# Patient Record
Sex: Female | Born: 1975 | Race: White | Hispanic: No | Marital: Single | Smoking: Former smoker
Health system: Southern US, Community
[De-identification: ages and names within clinical notes are randomized; demographics above are authoritative.]

## PROBLEM LIST (undated history)

## (undated) DIAGNOSIS — E669 Obesity, unspecified: Secondary | ICD-10-CM

## (undated) DIAGNOSIS — M797 Fibromyalgia: Secondary | ICD-10-CM

## (undated) DIAGNOSIS — E119 Type 2 diabetes mellitus without complications: Secondary | ICD-10-CM

## (undated) DIAGNOSIS — I10 Essential (primary) hypertension: Secondary | ICD-10-CM

## (undated) HISTORY — PX: CHOLECYSTECTOMY: SHX55

## (undated) HISTORY — PX: HAND SURGERY: SHX662

## (undated) HISTORY — PX: TUBAL LIGATION: SHX77

## (undated) HISTORY — PX: TONSILLECTOMY: SUR1361

---

## 2009-01-03 DIAGNOSIS — R519 Headache, unspecified: Secondary | ICD-10-CM | POA: Insufficient documentation

## 2018-09-13 ENCOUNTER — Emergency Department (HOSPITAL_BASED_OUTPATIENT_CLINIC_OR_DEPARTMENT_OTHER): Payer: Self-pay

## 2018-09-13 ENCOUNTER — Emergency Department (HOSPITAL_BASED_OUTPATIENT_CLINIC_OR_DEPARTMENT_OTHER)
Admission: EM | Admit: 2018-09-13 | Discharge: 2018-09-13 | Disposition: A | Discharge status: Home / Self Care | Payer: Self-pay | Attending: Emergency Medicine | Admitting: Emergency Medicine

## 2018-09-13 ENCOUNTER — Encounter (HOSPITAL_BASED_OUTPATIENT_CLINIC_OR_DEPARTMENT_OTHER): Payer: Self-pay | Admitting: Emergency Medicine

## 2018-09-13 ENCOUNTER — Other Ambulatory Visit: Payer: Self-pay

## 2018-09-13 DIAGNOSIS — E119 Type 2 diabetes mellitus without complications: Secondary | ICD-10-CM | POA: Insufficient documentation

## 2018-09-13 DIAGNOSIS — Z79899 Other long term (current) drug therapy: Secondary | ICD-10-CM | POA: Insufficient documentation

## 2018-09-13 DIAGNOSIS — R1032 Left lower quadrant pain: Secondary | ICD-10-CM | POA: Insufficient documentation

## 2018-09-13 DIAGNOSIS — F17210 Nicotine dependence, cigarettes, uncomplicated: Secondary | ICD-10-CM | POA: Insufficient documentation

## 2018-09-13 DIAGNOSIS — R110 Nausea: Secondary | ICD-10-CM | POA: Insufficient documentation

## 2018-09-13 DIAGNOSIS — I10 Essential (primary) hypertension: Secondary | ICD-10-CM | POA: Insufficient documentation

## 2018-09-13 HISTORY — DX: Type 2 diabetes mellitus without complications: E11.9

## 2018-09-13 HISTORY — DX: Essential (primary) hypertension: I10

## 2018-09-13 LAB — URINALYSIS, ROUTINE W REFLEX MICROSCOPIC
Bilirubin Urine: NEGATIVE
Glucose, UA: NEGATIVE mg/dL
Hgb urine dipstick: NEGATIVE
Ketones, ur: NEGATIVE mg/dL
Nitrite: NEGATIVE
Protein, ur: NEGATIVE mg/dL
Specific Gravity, Urine: 1.01 (ref 1.005–1.030)
pH: 5.5 (ref 5.0–8.0)

## 2018-09-13 LAB — CBC WITH DIFFERENTIAL/PLATELET
Abs Immature Granulocytes: 0.05 10*3/uL (ref 0.00–0.07)
Basophils Absolute: 0.1 10*3/uL (ref 0.0–0.1)
Basophils Relative: 1 %
Eosinophils Absolute: 0.3 10*3/uL (ref 0.0–0.5)
Eosinophils Relative: 3 %
HCT: 40.6 % (ref 36.0–46.0)
Hemoglobin: 13.1 g/dL (ref 12.0–15.0)
Immature Granulocytes: 0 %
Lymphocytes Relative: 24 %
Lymphs Abs: 2.9 10*3/uL (ref 0.7–4.0)
MCH: 26.8 pg (ref 26.0–34.0)
MCHC: 32.3 g/dL (ref 30.0–36.0)
MCV: 83.2 fL (ref 80.0–100.0)
Monocytes Absolute: 0.7 10*3/uL (ref 0.1–1.0)
Monocytes Relative: 6 %
Neutro Abs: 8.2 10*3/uL — ABNORMAL HIGH (ref 1.7–7.7)
Neutrophils Relative %: 66 %
Platelets: 411 10*3/uL — ABNORMAL HIGH (ref 150–400)
RBC: 4.88 MIL/uL (ref 3.87–5.11)
RDW: 14.6 % (ref 11.5–15.5)
WBC: 12.3 10*3/uL — ABNORMAL HIGH (ref 4.0–10.5)
nRBC: 0 % (ref 0.0–0.2)

## 2018-09-13 LAB — COMPREHENSIVE METABOLIC PANEL
ALT: 16 U/L (ref 0–44)
AST: 14 U/L — ABNORMAL LOW (ref 15–41)
Albumin: 3.7 g/dL (ref 3.5–5.0)
Alkaline Phosphatase: 75 U/L (ref 38–126)
Anion gap: 11 (ref 5–15)
BUN: 8 mg/dL (ref 6–20)
CO2: 24 mmol/L (ref 22–32)
Calcium: 8.9 mg/dL (ref 8.9–10.3)
Chloride: 103 mmol/L (ref 98–111)
Creatinine, Ser: 0.77 mg/dL (ref 0.44–1.00)
GFR calc Af Amer: >60 mL/min (ref >60)
GFR calc non Af Amer: >60 mL/min (ref >60)
Glucose, Bld: 179 mg/dL — ABNORMAL HIGH (ref 70–99)
Potassium: 3.7 mmol/L (ref 3.5–5.1)
Sodium: 138 mmol/L (ref 135–145)
Total Bilirubin: 0.5 mg/dL (ref 0.3–1.2)
Total Protein: 7.6 g/dL (ref 6.5–8.1)

## 2018-09-13 LAB — URINALYSIS, MICROSCOPIC (REFLEX)

## 2018-09-13 LAB — PREGNANCY, URINE: Preg Test, Ur: NEGATIVE

## 2018-09-13 MED ORDER — MORPHINE SULFATE (PF) 4 MG/ML IV SOLN
4.0000 mg | Freq: Once | INTRAVENOUS | Status: AC
  Administered 2018-09-13: 4 mg via INTRAVENOUS
  Filled 2018-09-13: qty 1

## 2018-09-13 MED ORDER — ONDANSETRON 4 MG PO TBDP: 4.0000 mg | ORAL_TABLET | Freq: Three times a day (TID) | ORAL | 0 refills | Status: DC | PRN

## 2018-09-13 MED ORDER — FAMOTIDINE 20 MG PO TABS: 20.0000 mg | ORAL_TABLET | Freq: Every day | ORAL | 0 refills | Status: DC

## 2018-09-13 MED ORDER — SODIUM CHLORIDE 0.9 % IV BOLUS
1000.0000 mL | Freq: Once | INTRAVENOUS | Status: AC
  Administered 2018-09-13: 1000 mL via INTRAVENOUS

## 2018-09-13 MED ORDER — IOHEXOL 300 MG/ML  SOLN
100.0000 mL | Freq: Once | INTRAMUSCULAR | Status: AC | PRN
  Administered 2018-09-13: 100 mL via INTRAVENOUS

## 2018-09-13 MED ORDER — ONDANSETRON HCL 4 MG/2ML IJ SOLN
4.0000 mg | Freq: Once | INTRAMUSCULAR | Status: AC
  Administered 2018-09-13: 4 mg via INTRAVENOUS
  Filled 2018-09-13: qty 2

## 2018-09-13 MED ORDER — DICYCLOMINE HCL 20 MG PO TABS: 20.0000 mg | ORAL_TABLET | Freq: Two times a day (BID) | ORAL | 0 refills | Status: DC

## 2018-09-13 NOTE — ED Triage Notes (Signed)
Pt c/o left flank pain with radiation to back that pt reports starting last night. Pt also c/o N, denies vomiting. Denies dysuria.

## 2018-09-13 NOTE — ED Notes (Signed)
ED Provider at bedside. 

## 2018-09-13 NOTE — ED Provider Notes (Signed)
[facility] EMERGENCY DEPARTMENT Provider Note   CSN: [id number] Arrival date & time: 09/13/18  0700     History   Chief Complaint Chief Complaint  Patient presents with   Flank Pain    HPI Nichole Stone is a 43 y.o. female.     Pt presents to the ED today with left sided flank pain and nausea.  She had similar sx about 2 weeks ago and it went away.  She developed pain again last night.  She has never had a kidney stone in the past.  The pt denies any f/c.  No sob or cough.     Past Medical History:  Diagnosis Date   Diabetes mellitus without complication (HCC)    Hypertension     There are no active problems to display for this patient.    OB History   No obstetric history on file.      Home Medications    Prior to Admission medications   Medication Sig Start Date End Date Taking? Authorizing Provider  dicyclomine (BENTYL) 20 MG tablet Take 1 tablet (20 mg total) by mouth 2 (two) times daily. 09/13/18   Jacalyn Lefevre, MD  famotidine (PEPCID) 20 MG tablet Take 1 tablet (20 mg total) by mouth daily. 09/13/18   Jacalyn Lefevre, MD  ondansetron (ZOFRAN ODT) 4 MG disintegrating tablet Take 1 tablet (4 mg total) by mouth every 8 (eight) hours as needed. 09/13/18   Jacalyn Lefevre, MD    Family History No family history on file.  Social History Social History   Tobacco Use   Smoking status: Current Every Day Smoker    Types: Cigarettes   Smokeless tobacco: Never Used  Substance Use Topics   Alcohol use: Never    Frequency: Never   Drug use: Never     Allergies   Lisinopril   Review of Systems Review of Systems  Gastrointestinal: Positive for nausea.  Genitourinary: Positive for flank pain.  All other systems reviewed and are negative.    Physical Exam Updated Vital Signs BP (!) 155/91    Pulse 80    Temp 98.5 F (36.9 C) (Oral)    Resp 18    Ht 5\' 4"  (1.626 m)    Wt 127 kg    LMP 08/30/2018 (Approximate)    SpO2 100%    BMI  48.06 kg/m   Physical Exam Vitals signs and nursing note reviewed.  Constitutional:      Appearance: Normal appearance. She is obese.  HENT:     Head: Normocephalic and atraumatic.     Right Ear: External ear normal.     Left Ear: External ear normal.     Nose: Nose normal.     Mouth/Throat:     Mouth: Mucous membranes are moist.     Pharynx: Oropharynx is clear.  Eyes:     Extraocular Movements: Extraocular movements intact.     Conjunctiva/sclera: Conjunctivae normal.     Pupils: Pupils are equal, round, and reactive to light.  Neck:     Musculoskeletal: Normal range of motion and neck supple.  Cardiovascular:     Rate and Rhythm: Normal rate and regular rhythm.     Pulses: Normal pulses.     Heart sounds: Normal heart sounds.  Pulmonary:     Effort: Pulmonary effort is normal.     Breath sounds: Normal breath sounds.  Abdominal:     General: Abdomen is flat. Bowel sounds are normal.  Palpations: Abdomen is soft.  Musculoskeletal: Normal range of motion.  Skin:    General: Skin is warm.     Capillary Refill: Capillary refill takes less than 2 seconds.  Neurological:     General: No focal deficit present.     Mental Status: She is alert and oriented to person, place, and time.  Psychiatric:        Mood and Affect: Mood normal.        Behavior: Behavior normal.        Thought Content: Thought content normal.        Judgment: Judgment normal.      ED Treatments / Results  Labs (all labs ordered are listed, but only abnormal results are displayed) Labs Reviewed  CBC WITH DIFFERENTIAL/PLATELET - Abnormal; Notable for the following components:      Result Value   WBC 12.3 (*)    Platelets 411 (*)    Neutro Abs 8.2 (*)    All other components within normal limits  COMPREHENSIVE METABOLIC PANEL - Abnormal; Notable for the following components:   Glucose, Bld 179 (*)    AST 14 (*)    All other components within normal limits  URINALYSIS, ROUTINE W REFLEX  MICROSCOPIC - Abnormal; Notable for the following components:   Leukocytes,Ua TRACE (*)    All other components within normal limits  URINALYSIS, MICROSCOPIC (REFLEX) - Abnormal; Notable for the following components:   Bacteria, UA FEW (*)    All other components within normal limits  PREGNANCY, URINE    EKG None  Radiology Ct Abdomen Pelvis W Contrast  Result Date: 09/13/2018 CLINICAL DATA:  Acute left flank pain. EXAM: CT ABDOMEN AND PELVIS WITH CONTRAST TECHNIQUE: Multidetector CT imaging of the abdomen and pelvis was performed using the standard protocol following bolus administration of intravenous contrast. CONTRAST:  159mL OMNIPAQUE IOHEXOL 300 MG/ML  SOLN COMPARISON:  None. FINDINGS: Lower chest: No acute abnormality. Hepatobiliary: No focal liver abnormality is seen. Status post cholecystectomy. No biliary dilatation. Pancreas: Unremarkable. No pancreatic ductal dilatation or surrounding inflammatory changes. Spleen: Normal in size without focal abnormality. Adrenals/Urinary Tract: 1.6 cm right adrenal nodule is noted. Left adrenal gland is unremarkable. No hydronephrosis or renal obstruction is noted. Small cyst is noted in lower pole of right kidney. No renal or ureteral calculi are noted. Urinary bladder is unremarkable. Stomach/Bowel: Stomach is within normal limits. Appendix appears normal. No evidence of bowel wall thickening, distention, or inflammatory changes. 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 No enlarged abdominal or pelvic lymph nodes. Reproductive: Uterus and bilateral adnexa are unremarkable. Other: No abdominal wall hernia or abnormality. No abdominopelvic ascites. Musculoskeletal: No acute or significant osseous findings. IMPRESSION: 1.6 cm calcified splenic artery aneurysm is noted. 1.6 cm right adrenal nodule is noted. Follow-up CT scan or MRI in 1 year is recommended to ensure stability. No other abnormality is noted in the abdomen or  pelvis. Electronically Signed   By: Marijo Conception M.D.   On: 09/13/2018 09:03    Procedures Procedures (including critical care time)  Medications Ordered in ED Medications  sodium chloride 0.9 % bolus 1,000 mL (0 mLs Intravenous Stopped 09/13/18 0902)  ondansetron (ZOFRAN) injection 4 mg (4 mg Intravenous Given 09/13/18 0739)  morphine 4 MG/ML injection 4 mg (4 mg Intravenous Given 09/13/18 0739)  iohexol (OMNIPAQUE) 300 MG/ML solution 100 mL (100 mLs Intravenous Contrast Given 09/13/18 0834)     Initial Impression / Assessment and Plan / ED Course  I have reviewed the triage vital signs and the nursing notes.  Pertinent labs & imaging results that were available during my care of the patient were reviewed by me and considered in my medical decision making (see chart for details).     Pt is feeling much better.  WBC is slightly elevated, but other labs ok.  CT shows nothing acute.  Pt is told of the splenic a. Aneurysm and the adrenal nodule and the need for f/u in 1 year for repeat CT scan.  Pt is stable for d/c home.  Return if worse.  Final Clinical Impressions(s) / ED Diagnoses   Final diagnoses:  Left lower quadrant abdominal pain    ED Discharge Orders         Ordered    dicyclomine (BENTYL) 20 MG tablet  2 times daily     09/13/18 0921    ondansetron (ZOFRAN ODT) 4 MG disintegrating tablet  Every 8 hours PRN     09/13/18 0922    famotidine (PEPCID) 20 MG tablet  Daily     09/13/18 0922           Jacalyn LefevreHaviland, Dara Camargo, MD 09/13/18 [phone]

## 2018-09-13 NOTE — Discharge Instructions (Signed)
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  You will need to get a repeat CT scan in 1 year to ensure stability.

## 2018-09-13 NOTE — ED Notes (Signed)
Patient transported to CT 

## 2018-11-03 ENCOUNTER — Emergency Department (HOSPITAL_BASED_OUTPATIENT_CLINIC_OR_DEPARTMENT_OTHER)
Admission: EM | Admit: 2018-11-03 | Discharge: 2018-11-03 | Disposition: A | Discharge status: Home / Self Care | Payer: Self-pay | Attending: Emergency Medicine | Admitting: Emergency Medicine

## 2018-11-03 ENCOUNTER — Emergency Department (HOSPITAL_BASED_OUTPATIENT_CLINIC_OR_DEPARTMENT_OTHER): Payer: Self-pay

## 2018-11-03 ENCOUNTER — Encounter (HOSPITAL_BASED_OUTPATIENT_CLINIC_OR_DEPARTMENT_OTHER): Payer: Self-pay

## 2018-11-03 ENCOUNTER — Other Ambulatory Visit: Payer: Self-pay

## 2018-11-03 DIAGNOSIS — Z79899 Other long term (current) drug therapy: Secondary | ICD-10-CM | POA: Insufficient documentation

## 2018-11-03 DIAGNOSIS — R109 Unspecified abdominal pain: Secondary | ICD-10-CM | POA: Insufficient documentation

## 2018-11-03 DIAGNOSIS — F17210 Nicotine dependence, cigarettes, uncomplicated: Secondary | ICD-10-CM | POA: Insufficient documentation

## 2018-11-03 DIAGNOSIS — E119 Type 2 diabetes mellitus without complications: Secondary | ICD-10-CM | POA: Insufficient documentation

## 2018-11-03 DIAGNOSIS — I10 Essential (primary) hypertension: Secondary | ICD-10-CM | POA: Insufficient documentation

## 2018-11-03 HISTORY — DX: Fibromyalgia: M79.7

## 2018-11-03 LAB — URINALYSIS, ROUTINE W REFLEX MICROSCOPIC
Bilirubin Urine: NEGATIVE
Glucose, UA: NEGATIVE mg/dL
Hgb urine dipstick: NEGATIVE
Ketones, ur: NEGATIVE mg/dL
Leukocytes,Ua: NEGATIVE
Nitrite: NEGATIVE
Protein, ur: NEGATIVE mg/dL
Specific Gravity, Urine: >1.03 — ABNORMAL HIGH (ref 1.005–1.030)
pH: 5.5 (ref 5.0–8.0)

## 2018-11-03 LAB — CBC
HCT: 39.1 % (ref 36.0–46.0)
Hemoglobin: 12.5 g/dL (ref 12.0–15.0)
MCH: 27.3 pg (ref 26.0–34.0)
MCHC: 32 g/dL (ref 30.0–36.0)
MCV: 85.4 fL (ref 80.0–100.0)
Platelets: 380 10*3/uL (ref 150–400)
RBC: 4.58 MIL/uL (ref 3.87–5.11)
RDW: 14.5 % (ref 11.5–15.5)
WBC: 10.6 10*3/uL — ABNORMAL HIGH (ref 4.0–10.5)
nRBC: 0 % (ref 0.0–0.2)

## 2018-11-03 LAB — COMPREHENSIVE METABOLIC PANEL
ALT: 13 U/L (ref 0–44)
AST: 13 U/L — ABNORMAL LOW (ref 15–41)
Albumin: 3.6 g/dL (ref 3.5–5.0)
Alkaline Phosphatase: 72 U/L (ref 38–126)
Anion gap: 9 (ref 5–15)
BUN: 9 mg/dL (ref 6–20)
CO2: 25 mmol/L (ref 22–32)
Calcium: 8.5 mg/dL — ABNORMAL LOW (ref 8.9–10.3)
Chloride: 105 mmol/L (ref 98–111)
Creatinine, Ser: 0.83 mg/dL (ref 0.44–1.00)
GFR calc Af Amer: >60 mL/min (ref >60)
GFR calc non Af Amer: >60 mL/min (ref >60)
Glucose, Bld: 133 mg/dL — ABNORMAL HIGH (ref 70–99)
Potassium: 3.6 mmol/L (ref 3.5–5.1)
Sodium: 139 mmol/L (ref 135–145)
Total Bilirubin: 0.5 mg/dL (ref 0.3–1.2)
Total Protein: 6.9 g/dL (ref 6.5–8.1)

## 2018-11-03 LAB — LIPASE, BLOOD: Lipase: 29 U/L (ref 11–51)

## 2018-11-03 LAB — PREGNANCY, URINE: Preg Test, Ur: NEGATIVE

## 2018-11-03 MED ORDER — HYDROCODONE-ACETAMINOPHEN 5-325 MG PO TABS: 2.0000 | ORAL_TABLET | Freq: Four times a day (QID) | ORAL | 0 refills | Status: DC | PRN

## 2018-11-03 MED ORDER — IOHEXOL 350 MG/ML SOLN
100.0000 mL | Freq: Once | INTRAVENOUS | Status: AC | PRN
  Administered 2018-11-03: 100 mL via INTRAVENOUS

## 2018-11-03 MED ORDER — HYDROMORPHONE HCL 1 MG/ML IJ SOLN
1.0000 mg | Freq: Once | INTRAMUSCULAR | Status: AC
  Administered 2018-11-03: 20:00:00 1 mg via INTRAVENOUS
  Filled 2018-11-03: qty 1

## 2018-11-03 MED ORDER — SODIUM CHLORIDE 0.9% FLUSH
3.0000 mL | Freq: Once | INTRAVENOUS | Status: DC
  Filled 2018-11-03: qty 3

## 2018-11-03 MED ORDER — ONDANSETRON HCL 4 MG/2ML IJ SOLN
4.0000 mg | Freq: Once | INTRAMUSCULAR | Status: AC
  Administered 2018-11-03: 4 mg via INTRAVENOUS
  Filled 2018-11-03: qty 2

## 2018-11-03 NOTE — ED Notes (Signed)
Patient transported to CT 

## 2018-11-03 NOTE — ED Notes (Signed)
ED Provider at bedside. 

## 2018-11-03 NOTE — ED Triage Notes (Signed)
Pt c/o LLQ pain that radiates to back x 2 days-states she was seen here for same recently -NAD-steady gait

## 2018-11-03 NOTE — Discharge Instructions (Addendum)
Your CT scan today is stable from prior imaging.  The aneurysm is unchanged in size.  I'm not sure if this is actually the cause of your pain, but I would recommend following up with vascular surgery.  Please see the attached contact information. Take the prescribed pain medication as needed.

## 2018-11-03 NOTE — ED Provider Notes (Signed)
[facility] EMERGENCY DEPARTMENT Provider Note   CSN: [id number] Arrival date & time: 11/03/18  1850     History   Chief Complaint Chief Complaint  Patient presents with   Abdominal Pain    HPI Nichole Stone is a 43 y.o. female.     HPI   43 year old female with left abdominal/flank pain.  She was actually seen in the emergency room almost 2 months ago for similar symptoms.  She had imaging at that time which was significant for a 1.6 cm splenic artery aneurysm.  Otherwise imaging pretty unremarkable as well as her other work-up with regards to etiology.  Her symptoms improved from that time although she would occasionally get a twinge of pain in the same area.  Yesterday she began having more severe symptoms.  Now the pain is radiating into her back somewhat.  No shoulder pain.  No nausea or vomiting.  No urinary complaints.  No change in her bowel movements.  No dizziness or lightheadedness.  She not anticoagulated.  Past Medical History:  Diagnosis Date   Diabetes mellitus without complication ([place])    Fibromyalgia    Hypertension     There are no active problems to display for this patient.   Past Surgical History:  Procedure Laterality Date   CHOLECYSTECTOMY     TONSILLECTOMY     TUBAL LIGATION       OB History   No obstetric history on file.      Home Medications    Prior to Admission medications   Medication Sig Start Date End Date Taking? Authorizing Provider  dicyclomine (BENTYL) 20 MG tablet Take 1 tablet (20 mg total) by mouth 2 (two) times daily. 09/13/18   Isla Pence, MD  famotidine (PEPCID) 20 MG tablet Take 1 tablet (20 mg total) by mouth daily. 09/13/18   Isla Pence, MD  ondansetron (ZOFRAN ODT) 4 MG disintegrating tablet Take 1 tablet (4 mg total) by mouth every 8 (eight) hours as needed. 09/13/18   Isla Pence, MD    Family History No family history on file.  Social History Social History   Tobacco Use     Smoking status: Current Every Day Smoker    Types: Cigarettes   Smokeless tobacco: Never Used  Substance Use Topics   Alcohol use: Never    Frequency: Never   Drug use: Never     Allergies   Lisinopril   Review of Systems Review of Systems All systems reviewed and negative, other than as noted in HPI.   Physical Exam Updated Vital Signs BP (!) 157/89 (BP Location: Right Arm)    Pulse 81    Temp 98.9 F (37.2 C) (Oral)    Resp 20    Ht 5\' 5"  (1.651 m)    Wt 131.5 kg    LMP 10/20/2018    SpO2 98%    BMI 48.26 kg/m   Physical Exam Vitals signs and nursing note reviewed.  Constitutional:      General: She is not in acute distress.    Appearance: She is well-developed. She is obese.  HENT:     Head: Normocephalic and atraumatic.  Eyes:     General:        Right eye: No discharge.        Left eye: No discharge.     Conjunctiva/sclera: Conjunctivae normal.  Neck:     Musculoskeletal: Neck supple.  Cardiovascular:     Rate and Rhythm: Normal rate and regular  rhythm.     Heart sounds: Normal heart sounds. No murmur. No friction rub. No gallop.   Pulmonary:     Effort: Pulmonary effort is normal. No respiratory distress.     Breath sounds: Normal breath sounds.  Abdominal:     General: There is no distension.     Palpations: Abdomen is soft.     Tenderness: There is abdominal tenderness.     Comments: Mild tenderness to palpation in left upper and left lower quadrant without rebound or guarding.  No distention.  No CVA tenderness.  Musculoskeletal:        General: No tenderness.  Skin:    General: Skin is warm and dry.  Neurological:     Mental Status: She is alert.  Psychiatric:        Behavior: Behavior normal.        Thought Content: Thought content normal.      ED Treatments / Results  Labs (all labs ordered are listed, but only abnormal results are displayed) Labs Reviewed  COMPREHENSIVE METABOLIC PANEL - Abnormal; Notable for the following  components:      Result Value   Glucose, Bld 133 (*)    Calcium 8.5 (*)    AST 13 (*)    All other components within normal limits  CBC - Abnormal; Notable for the following components:   WBC 10.6 (*)    All other components within normal limits  URINALYSIS, ROUTINE W REFLEX MICROSCOPIC - Abnormal; Notable for the following components:   APPearance CLOUDY (*)    Specific Gravity, Urine >1.030 (*)    All other components within normal limits  LIPASE, BLOOD  PREGNANCY, URINE    EKG None  Radiology No results found.   Ct Abdomen Pelvis W Contrast  Result Date: 11/03/2018 CLINICAL DATA:  Bruit with left abdominal and flank pain history of splenic artery aneurysm on prior imaging left lower quadrant pain EXAM: CT ABDOMEN AND PELVIS WITH CONTRAST TECHNIQUE: Multidetector CT imaging of the abdomen and pelvis was performed using the standard protocol following bolus administration of intravenous contrast. CONTRAST:  100mL OMNIPAQUE IOHEXOL 350 MG/ML SOLN COMPARISON:  09/13/2018 CT FINDINGS: Lower chest: Lung bases demonstrate no acute consolidation or pleural effusion. The heart size is within normal limits. Hepatobiliary: No focal liver abnormality is seen. Status post cholecystectomy. No biliary dilatation. Pancreas: Unremarkable. No pancreatic ductal dilatation or surrounding inflammatory changes. Spleen: Normal in size without focal abnormality. Adrenals/Urinary Tract: Stable 15 mm right adrenal mass. Left adrenal gland is normal. Kidneys show no hydronephrosis. The urinary bladder is unremarkable. Small cyst lower pole right kidney. Stomach/Bowel: Stomach is within normal limits. Appendix appears normal. No evidence of bowel wall thickening, distention, or inflammatory changes. Vascular/Lymphatic: Nonaneurysmal aorta. Stable 1.6 cm calcified left splenic artery aneurysm. No surrounding hematoma or stranding. Reproductive: Uterus unremarkable. No suspicious adnexal mass. Bilateral ligation  clips. Other: Negative for free air or free fluid. Small fat in the umbilical region Musculoskeletal: No acute or significant osseous findings. IMPRESSION: 1. No CT evidence for acute intra-abdominal or pelvic abnormality. Stable size and appearance of 1.6 cm calcified left splenic artery aneurysm. 2. 1.5 cm right adrenal mass for which follow-up imaging was previously recommended Electronically Signed   By: Jasmine PangKim  Fujinaga M.D.   On: 11/03/2018 21:08    Procedures Procedures (including critical care time)  Medications Ordered in ED Medications  sodium chloride flush (NS) 0.9 % injection 3 mL (3 mLs Intravenous Not Given 11/03/18 1932)  HYDROmorphone (DILAUDID)  injection 1 mg (has no administration in time range)     Initial Impression / Assessment and Plan / ED Course  I have reviewed the triage vital signs and the nursing notes.  Pertinent labs & imaging results that were available during my care of the patient were reviewed by me and considered in my medical decision making (see chart for details).  Unclear etiology of her abdominal pain.  Splenic artery aneurysm again noted, unchanged from prior imaging.  Could potentially be the etiology of her symptoms although I am not convinced of this.  He did not have a clear alternative diagnosis at this time though.  Given the fact that she is having continued symptoms, I advised that she make an appointment to see vascular surgery.  Return precautions were discussed.  Outpatient follow-up otherwise.  Final Clinical Impressions(s) / ED Diagnoses   Final diagnoses:  Abdominal pain, unspecified abdominal location    ED Discharge Orders    None       Raeford Razor, MD 11/07/18 1053

## 2018-11-03 NOTE — ED Notes (Signed)
Pt complaining of nausea, EDP notfied. See new orders

## 2018-11-11 ENCOUNTER — Other Ambulatory Visit: Payer: Self-pay

## 2018-11-11 ENCOUNTER — Ambulatory Visit (INDEPENDENT_AMBULATORY_CARE_PROVIDER_SITE_OTHER): Payer: Self-pay | Admitting: Vascular Surgery

## 2018-11-11 ENCOUNTER — Encounter: Payer: Self-pay | Admitting: Vascular Surgery

## 2018-11-11 VITALS — BP 141/91 | HR 81 | Temp 97.7°F | Resp 20 | Ht 65.0 in | Wt 286.0 lb

## 2018-11-11 DIAGNOSIS — I728 Aneurysm of other specified arteries: Secondary | ICD-10-CM

## 2018-11-11 NOTE — Progress Notes (Signed)
Patient ID: Nichole Stone, female   DOB: 1975-07-13, 43 y.o.   MRN: [id number]  Reason for Consult: New Patient (Initial Visit)   Referred by No ref. provider found  Subjective:     HPI:  Nichole Stone is a 43 y.o. female with now a few month history of left abdominal pain.  She states this is really in her left lower quadrant radiating down.  She was found to have splenic artery aneurysm.  Has no other history of aneurysm disease.  She is having normal bowel movements.  Denies nausea or vomiting.  Patient does not take any anticoagulation.  She did not know about this aneurysm until a few months ago when it was noticed on CT scan.  Past Medical History:  Diagnosis Date  . Diabetes mellitus without complication ([place])   . Fibromyalgia   . Hypertension    History reviewed. No pertinent family history. Past Surgical History:  Procedure Laterality Date  . CHOLECYSTECTOMY    . TONSILLECTOMY    . TUBAL LIGATION      Short Social History:  Social History   Tobacco Use  . Smoking status: Current Every Day Smoker    Packs/day: 0.25    Types: Cigarettes  . Smokeless tobacco: Never Used  Substance Use Topics  . Alcohol use: Never    Frequency: Never    Allergies  Allergen Reactions  . Lisinopril     No current outpatient medications on file.   No current facility-administered medications for this visit.     Review of Systems  Constitutional:  Constitutional negative. HENT: HENT negative.  Eyes: Eyes negative.  Respiratory: Respiratory negative.  Cardiovascular: Cardiovascular negative.  GI: Positive for abdominal pain.  Musculoskeletal: Musculoskeletal negative.  Skin: Skin negative.  Neurological: Neurological negative. Hematologic: Hematologic/lymphatic negative.  Psychiatric: Psychiatric negative.        Objective:  Objective   Vitals:   11/11/18 1224  BP: (!) 141/91  Pulse: 81  Resp: 20  Temp: 97.7 F (36.5 C)  SpO2: 98%  Weight:  286 lb (129.7 kg)  Height: 5\' 5"  (1.651 m)   Body mass index is 47.59 kg/m.  Physical Exam HENT:     Head: Normocephalic.     Nose: Nose normal.  Eyes:     Pupils: Pupils are equal, round, and reactive to light.  Neck:     Musculoskeletal: Normal range of motion.  Cardiovascular:     Rate and Rhythm: Normal rate and regular rhythm.     Pulses:          Radial pulses are 2+ on the right side and 2+ on the left side.       Dorsalis pedis pulses are 2+ on the right side and 2+ on the left side.  Pulmonary:     Effort: Pulmonary effort is normal.  Abdominal:     General: Abdomen is flat.     Palpations: Abdomen is soft. There is no mass.  Musculoskeletal: Normal range of motion.        General: No swelling.  Skin:    General: Skin is warm.     Capillary Refill: Capillary refill takes less than 2 seconds.  Neurological:     General: No focal deficit present.     Mental Status: She is alert.  Psychiatric:        Mood and Affect: Mood normal.        Thought Content: Thought content normal.  Judgment: Judgment normal.     Data: CT IMPRESSION: 1. No CT evidence for acute intra-abdominal or pelvic abnormality. Stable size and appearance of 1.6 cm calcified left splenic artery aneurysm. 2. 1.5 cm right adrenal mass for which follow-up imaging was previously recommended  I reviewed the patient's CT scan with her demonstrates a calcified 1.6 cm splenic artery aneurysm that is near the hilum of the spleen.     Assessment/Plan:    43 year old female presents for evaluation of 1.6 cm splenic artery aneurysm.  This is in the setting of left lower quadrant pain.  She has no tenderness to palpation in her upper abdomen.  She has no plans to get pregnant in the future which should lessen the risk of the aneurysm.  I discussed with her that treatment of this aneurysm would likely require splenectomy given its location near the hilum of the spleen.  Given that it is calcified and  under 2 cm I think it needs no treatment at this time.  I will get her to follow-up in the next 12 to 18 months with repeat CT scan and if at that time the aneurysm is stable she will need no further follow-up for this problem.      Maeola Harman MD [facility]

## 2018-11-15 ENCOUNTER — Encounter: Payer: Self-pay | Admitting: Vascular Surgery

## 2018-11-22 ENCOUNTER — Encounter: Payer: Self-pay | Admitting: Vascular Surgery

## 2019-01-18 ENCOUNTER — Emergency Department (HOSPITAL_BASED_OUTPATIENT_CLINIC_OR_DEPARTMENT_OTHER)
Admission: EM | Admit: 2019-01-18 | Discharge: 2019-01-18 | Disposition: A | Discharge status: Home / Self Care | Payer: Self-pay | Attending: Emergency Medicine | Admitting: Emergency Medicine

## 2019-01-18 ENCOUNTER — Encounter (HOSPITAL_BASED_OUTPATIENT_CLINIC_OR_DEPARTMENT_OTHER): Payer: Self-pay | Admitting: *Deleted

## 2019-01-18 ENCOUNTER — Emergency Department (HOSPITAL_BASED_OUTPATIENT_CLINIC_OR_DEPARTMENT_OTHER): Payer: Self-pay

## 2019-01-18 ENCOUNTER — Other Ambulatory Visit: Payer: Self-pay

## 2019-01-18 DIAGNOSIS — F17210 Nicotine dependence, cigarettes, uncomplicated: Secondary | ICD-10-CM | POA: Insufficient documentation

## 2019-01-18 DIAGNOSIS — R079 Chest pain, unspecified: Secondary | ICD-10-CM

## 2019-01-18 DIAGNOSIS — I10 Essential (primary) hypertension: Secondary | ICD-10-CM | POA: Insufficient documentation

## 2019-01-18 DIAGNOSIS — F439 Reaction to severe stress, unspecified: Secondary | ICD-10-CM | POA: Insufficient documentation

## 2019-01-18 DIAGNOSIS — R0789 Other chest pain: Secondary | ICD-10-CM | POA: Insufficient documentation

## 2019-01-18 DIAGNOSIS — E119 Type 2 diabetes mellitus without complications: Secondary | ICD-10-CM | POA: Insufficient documentation

## 2019-01-18 MED ORDER — ALUM & MAG HYDROXIDE-SIMETH 200-200-20 MG/5ML PO SUSP
30.0000 mL | Freq: Once | ORAL | Status: AC
  Administered 2019-01-18: 30 mL via ORAL
  Filled 2019-01-18: qty 30

## 2019-01-18 MED ORDER — LIDOCAINE VISCOUS HCL 2 % MT SOLN
15.0000 mL | Freq: Once | OROMUCOSAL | Status: AC
  Administered 2019-01-18: 15 mL via ORAL
  Filled 2019-01-18: qty 15

## 2019-01-18 NOTE — ED Provider Notes (Signed)
[facility] EMERGENCY DEPARTMENT Provider Note  CSN: [id number] Arrival date & time: 01/18/19 1607  Chief Complaint(s) Panic Attack  HPI Nichole Stone is a 44 y.o. female   The history is provided by the patient.  Chest Pain Pain location:  Substernal area Pain quality: pressure   Pain radiates to:  Does not radiate Pain severity:  Moderate Onset quality:  Sudden Duration:  5 hours Timing:  Constant Chronicity:  Recurrent Context comment:  Similar to prior anxiety. Started today while arguing with boyfriend Relieved by:  Nothing Worsened by:  Nothing Associated symptoms: anxiety   Associated symptoms: no back pain, no claudication, no cough, no diaphoresis, no fatigue, no nausea, no shortness of breath and no vomiting   Risk factors: obesity   Risk factors: no diabetes mellitus (Taken off of meds due to improvement) and no hypertension        Past Medical History Past Medical History:  Diagnosis Date  . Diabetes mellitus without complication (HCC)   . Fibromyalgia   . Hypertension    There are no problems to display for this patient.  Home Medication(s) Prior to Admission medications   Not on File                                                                                                                                    Past Surgical History Past Surgical History:  Procedure Laterality Date  . CHOLECYSTECTOMY    . TONSILLECTOMY    . TUBAL LIGATION     Family History No family history on file.  Social History Social History   Tobacco Use  . Smoking status: Current Every Day Smoker    Packs/day: 0.25    Types: Cigarettes  . Smokeless tobacco: Never Used  Substance Use Topics  . Alcohol use: Never  . Drug use: Never   Allergies Lisinopril  Review of Systems Review of Systems  Constitutional: Negative for diaphoresis and fatigue.  Respiratory: Negative for cough and shortness of breath.   Cardiovascular: Positive for chest  pain. Negative for claudication.  Gastrointestinal: Negative for nausea and vomiting.  Musculoskeletal: Negative for back pain.   All other systems are reviewed and are negative for acute change except as noted in the HPI  Physical Exam Vital Signs  I have reviewed the triage vital signs BP (!) 148/64   Pulse 88   Resp 19   Ht 5\' 5"  (1.651 m)   Wt 127 kg   LMP 01/13/2019   SpO2 99%   BMI 46.59 kg/m   Physical Exam Vitals reviewed.  Constitutional:      General: She is not in acute distress.    Appearance: She is well-developed. She is obese. She is not diaphoretic.  HENT:     Head: Normocephalic and atraumatic.     Nose: Nose normal.  Eyes:     General: No scleral icterus.  Right eye: No discharge.        Left eye: No discharge.     Conjunctiva/sclera: Conjunctivae normal.     Pupils: Pupils are equal, round, and reactive to light.  Cardiovascular:     Rate and Rhythm: Normal rate and regular rhythm.     Heart sounds: No murmur. No friction rub. No gallop.   Pulmonary:     Effort: Pulmonary effort is normal. No respiratory distress.     Breath sounds: Normal breath sounds. No stridor. No rales.  Abdominal:     General: There is no distension.     Palpations: Abdomen is soft.     Tenderness: There is no abdominal tenderness.  Musculoskeletal:        General: No tenderness.     Cervical back: Normal range of motion and neck supple.  Skin:    General: Skin is warm and dry.     Findings: No erythema or rash.  Neurological:     Mental Status: She is alert and oriented to person, place, and time.     ED Results and Treatments Labs (all labs ordered are listed, but only abnormal results are displayed) Labs Reviewed - No data to display                                                                                                                       EKG  EKG Interpretation  Date/Time:  Wednesday January 18 2019 00:22:51 EST Ventricular Rate:  90 PR  Interval:  160 QRS Duration: 86 QT Interval:  356 QTC Calculation: 435 R Axis:   67 Text Interpretation: Normal sinus rhythm Normal ECG NO STEMI. No old tracing to compare Confirmed by Addison Lank [phone]) on 01/18/2019 12:29:56 AM      Radiology CXR 2 View - Chest pain  Result Date: 01/18/2019 CLINICAL DATA:  Chest pain with exactly for 2 hours EXAM: CHEST - 2 VIEW COMPARISON:  None. FINDINGS: Low volumes with streaky atelectatic change. No consolidation, features of edema, pneumothorax, or effusion. The cardiomediastinal contours are unremarkable. No acute osseous or soft tissue abnormality. Mild degenerative changes are present in the imaged spine and shoulders. IMPRESSION: Low volumes with streaky atelectatic change. Otherwise no active cardiopulmonary disease. Electronically Signed   By: Lovena Le M.D.   On: 01/18/2019 02:02    Pertinent labs & imaging results that were available during my care of the patient were reviewed by me and considered in my medical decision making (see chart for details).  Medications Ordered in ED Medications  alum & mag hydroxide-simeth (MAALOX/MYLANTA) 200-200-20 MG/5ML suspension 30 mL (30 mLs Oral Given 01/18/19 0129)    And  lidocaine (XYLOCAINE) 2 % viscous mouth solution 15 mL (15 mLs Oral Given 01/18/19 0129)  Procedures Procedures  (including critical care time)  Medical Decision Making / ED Course I have reviewed the nursing notes for this encounter and the patient's prior records (if available in EHR or on provided paperwork).   Nichole Stone was evaluated in Emergency Department on 01/18/2019 for the symptoms described in the history of present illness. She was evaluated in the context of the global COVID-19 pandemic, which necessitated consideration that the patient might be at risk for infection with the  SARS-CoV-2 virus that causes COVID-19. Institutional protocols and algorithms that pertain to the evaluation of patients at risk for COVID-19 are in a state of rapid change based on information released by regulatory bodies including the CDC and federal and state organizations. These policies and algorithms were followed during the patient's care in the ED.  Patient presents with substernal chest pain in the setting of emotional stress. EKG without acute ischemic changes or evidence of pericarditis. Chest x-ray without evidence suggestive of pneumonia, pneumothorax, pneumomediastinum.  No abnormal contour of the mediastinum to suggest dissection. No evidence of acute injuries.  Low suspicion for ACS. Pain completely resolved after breathing techniques and GI cocktail.  Low suspicion for pulmonary embolism. Presentation not classic for aortic dissection or esophageal perforation.  The patient appears reasonably screened and/or stabilized for discharge and I doubt any other medical condition or other [facility] requiring further screening, evaluation, or treatment in the ED at this time prior to discharge.  The patient is safe for discharge with strict return precautions.       Final Clinical Impression(s) / ED Diagnoses Final diagnoses:  Chest pain  Stress at home     The patient appears reasonably screened and/or stabilized for discharge and I doubt any other medical condition or other [facility] requiring further screening, evaluation, or treatment in the ED at this time prior to discharge.  Disposition: Discharge  Condition: Good  I have discussed the results, Dx and Tx plan with the patient who expressed understanding and agree(s) with the plan. Discharge instructions discussed at great length. The patient was given strict return precautions who verbalized understanding of the instructions. No further questions at time of discharge.    ED Discharge Orders    None        Follow  Up: Primary care provider  Schedule an appointment as soon as possible for a visit  If you do not have a primary care physician, contact [facility] at [phone] for referral     This chart was dictated using voice recognition software.  Despite best efforts to proofread,  errors can occur which can change the documentation meaning.   Nira Conn, MD 01/18/19 0221

## 2019-01-18 NOTE — ED Triage Notes (Signed)
Pt c/o " chest pain with anxiety" x 2 hrs , also c/o SOB

## 2019-01-18 NOTE — ED Notes (Signed)
Patient transported to X-ray 

## 2019-02-25 ENCOUNTER — Ambulatory Visit (HOSPITAL_COMMUNITY)
Admission: EM | Admit: 2019-02-25 | Discharge: 2019-02-25 | Disposition: A | Discharge status: Home / Self Care | Payer: Self-pay | Attending: Physician Assistant | Admitting: Physician Assistant

## 2019-02-25 ENCOUNTER — Encounter (HOSPITAL_COMMUNITY): Payer: Self-pay

## 2019-02-25 ENCOUNTER — Other Ambulatory Visit: Payer: Self-pay

## 2019-02-25 DIAGNOSIS — R1032 Left lower quadrant pain: Secondary | ICD-10-CM

## 2019-02-25 MED ORDER — TRAMADOL HCL 50 MG PO TABS: 50.0000 mg | ORAL_TABLET | Freq: Four times a day (QID) | ORAL | 0 refills | Status: AC | PRN

## 2019-02-25 NOTE — Discharge Instructions (Signed)
Please follow up with the Gyn group attached on Monday to further discuss your pain  Take the tramadol as directed  You may also take tylenol and ibuprofen for your pain  If you develop worsening pain, fever, vomiting, bloody diarrhea, please go to the emergency department.

## 2019-02-25 NOTE — ED Triage Notes (Signed)
Patient presents to Urgent Care with complaints of abdominal pain since a few months ago. Patient reports she was diagnosed w/ diverticulitis and was given an antibiotic and educated on eating habits, pt states the pain returned a few days ago.

## 2019-02-25 NOTE — ED Provider Notes (Signed)
[facility]    CSN: [id number] Arrival date & time: 02/25/19  1346      History   Chief Complaint Chief Complaint  Patient presents with  . Abdominal Pain    HPI Nichole Stone is a 44 y.o. female.   Patient reports to urgent care today for left lower abdominal pain that has been present for atleast 4 months on and off and had recently to the last 1 week returned. She reports sharp left lower abdominal pain that at times is a 10/10. She states this has been present in some capacity since 10/2018. She denies fever, chills, diarrhea, blood stool, constipation, hard stool, dark tarry stool. Denies painful urination, frequency, urgency, vaginal discharge, irregular bleeding.  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 She had vascular surgery follow up and surgery was decided against. She states the surgeon did not feel the aneurysm would be causing her pain. She was then evaluated at an outside facility urgent care about 1 month ago and thought to have diverticulitis. She received treatment for this and believed that helped. However over the last 1 week the pain has begun again.   She has not had regular Gyn care and has both ovaries and her uterus still. She had tubal ligation and cholecystectomy as her only abdominal surgeries. No family history of colon cancers.   She has not tried any non-prescription medications.     Past Medical History:  Diagnosis Date  . Diabetes mellitus without complication (HCC)   . Fibromyalgia   . Hypertension     There are no problems to display for this patient.   Past Surgical History:  Procedure Laterality Date  . CHOLECYSTECTOMY    . TONSILLECTOMY    . TUBAL LIGATION      OB History   No obstetric history on file.      Home Medications    Prior to Admission medications   Medication Sig Start Date End Date  Taking? Authorizing Provider  traMADol (ULTRAM) 50 MG tablet Take 1 tablet (50 mg total) by mouth every 6 (six) hours as needed for up to 5 days for moderate pain or severe pain. 02/25/19 03/02/19  Sharlize Hoar, Veryl Speak, PA-C    Family History Family History  Problem Relation Age of Onset  . Diabetes Mother   . Hypertension Mother   . Cirrhosis Mother   . Cancer Mother   . Diabetes Father   . Hypertension Father   . Parkinson's disease Father   . Dementia Father     Social History Social History   Tobacco Use  . Smoking status: Current Every Day Smoker    Packs/day: 0.25    Years: 10.00    Pack years: 2.50    Types: Cigarettes  . Smokeless tobacco: Never Used  Substance Use Topics  . Alcohol use: Never  . Drug use: Never     Allergies   Lisinopril   Review of Systems Review of Systems  Constitutional: Negative for chills and fever.  HENT: Negative for ear pain and sore throat.   Eyes: Negative for pain and visual disturbance.  Respiratory: Negative for cough and shortness of breath.   Cardiovascular: Negative for chest pain and palpitations.  Gastrointestinal: Positive for abdominal pain. Negative for abdominal distention, anal bleeding, blood in stool, constipation, diarrhea, nausea, rectal pain and vomiting.  Genitourinary: Negative for dysuria and hematuria.  Musculoskeletal: Negative for arthralgias, back pain and myalgias.  Skin: Negative for color change and rash.  Neurological: Negative for seizures, syncope and headaches.  All other systems reviewed and are negative.    Physical Exam Triage Vital Signs ED Triage Vitals  Enc Vitals Group     BP 02/25/19 1400 (!) 154/85     Pulse Rate 02/25/19 1400 84     Resp 02/25/19 1400 16     Temp 02/25/19 1400 99.2 F (37.3 C)     Temp Source 02/25/19 1400 Oral     SpO2 02/25/19 1400 99 %     Weight --      Height --      Head Circumference --      Peak Flow --      Pain Score 02/25/19 1359 5     Pain Loc --       Pain Edu? --      Excl. in [place]? --    No data found.  Updated Vital Signs BP (!) 154/85 (BP Location: Left Arm)   Pulse 84   Temp 99.2 F (37.3 C) (Oral)   Resp 16   SpO2 99%   Visual Acuity Right Eye Distance:   Left Eye Distance:   Bilateral Distance:    Right Eye Near:   Left Eye Near:    Bilateral Near:     Physical Exam Vitals and nursing note reviewed.  Constitutional:      General: She is not in acute distress.    Appearance: She is well-developed. She is obese. She is not ill-appearing.  HENT:     Head: Normocephalic and atraumatic.  Eyes:     General: No scleral icterus.    Extraocular Movements: Extraocular movements intact.     Conjunctiva/sclera: Conjunctivae normal.     Pupils: Pupils are equal, round, and reactive to light.  Cardiovascular:     Rate and Rhythm: Normal rate and regular rhythm.     Heart sounds: No murmur.  Pulmonary:     Effort: Pulmonary effort is normal. No respiratory distress.     Breath sounds: Normal breath sounds. No wheezing, rhonchi or rales.  Abdominal:     General: Abdomen is protuberant. A surgical scar is present. Bowel sounds are normal. There is no distension.     Palpations: Abdomen is soft. There is no hepatomegaly or splenomegaly.     Tenderness: There is abdominal tenderness in the left lower quadrant. There is rebound. There is no right CVA tenderness, left CVA tenderness or guarding. Negative signs include Rovsing's sign.     Hernia: No hernia is present.  Musculoskeletal:     Cervical back: Neck supple.  Skin:    General: Skin is warm and dry.  Neurological:     General: No focal deficit present.     Mental Status: She is alert and oriented to person, place, and time.  Psychiatric:        Mood and Affect: Mood normal.        Behavior: Behavior normal.      UC Treatments / Results  Labs (all labs ordered are listed, but only abnormal results are displayed) Labs Reviewed - No data to  display  EKG   Radiology No results found.  Procedures Procedures (including critical care time)  Medications Ordered in UC Medications - No data to display  Initial Impression / Assessment and Plan / UC Course  I have reviewed the triage vital signs and the nursing  notes.  Pertinent labs & imaging results that were available during my care of the patient were reviewed by me and considered in my medical decision making (see chart for details).  Chart review conducted and old imaging reviewed    #LLQ Pain Though she had improvement with treatment of diverticular disease, she had no evidence of diverticulitis on CT with exact same presenting symptoms in 10/2018 per chart review. She is afebrile and has no other GI symptoms. Chronicity of pain and lack of other evidence point away from diverticular disease. Considering ovaries are in place, consideration for Gyn causes must be made. [facility] clinic information given and instructed to contact 1st thing Monday - ED precautions discussed - tramadol for pain - patient agrees with plan  Final Clinical Impressions(s) / UC Diagnoses   Final diagnoses:  Abdominal pain, left lower quadrant     Discharge Instructions     Please follow up with the Gyn group attached on Monday to further discuss your pain  Take the tramadol as directed  You may also take tylenol and ibuprofen for your pain  If you develop worsening pain, fever, vomiting, bloody diarrhea, please go to the emergency department.      ED Prescriptions    Medication Sig Dispense Auth. Provider   traMADol (ULTRAM) 50 MG tablet Take 1 tablet (50 mg total) by mouth every 6 (six) hours as needed for up to 5 days for moderate pain or severe pain. 15 tablet Arieona Swaggerty, Veryl Speak, PA-C     I have reviewed the PDMP during this encounter.   Hermelinda Medicus, PA-C 02/25/19 2159

## 2019-03-03 ENCOUNTER — Other Ambulatory Visit: Payer: Self-pay

## 2019-03-03 ENCOUNTER — Emergency Department (HOSPITAL_BASED_OUTPATIENT_CLINIC_OR_DEPARTMENT_OTHER)
Admission: EM | Admit: 2019-03-03 | Discharge: 2019-03-03 | Disposition: A | Discharge status: Home / Self Care | Payer: Self-pay | Attending: Emergency Medicine | Admitting: Emergency Medicine

## 2019-03-03 ENCOUNTER — Encounter (HOSPITAL_BASED_OUTPATIENT_CLINIC_OR_DEPARTMENT_OTHER): Payer: Self-pay | Admitting: Emergency Medicine

## 2019-03-03 DIAGNOSIS — F17210 Nicotine dependence, cigarettes, uncomplicated: Secondary | ICD-10-CM | POA: Insufficient documentation

## 2019-03-03 DIAGNOSIS — R1110 Vomiting, unspecified: Secondary | ICD-10-CM | POA: Insufficient documentation

## 2019-03-03 DIAGNOSIS — I10 Essential (primary) hypertension: Secondary | ICD-10-CM | POA: Insufficient documentation

## 2019-03-03 DIAGNOSIS — E119 Type 2 diabetes mellitus without complications: Secondary | ICD-10-CM | POA: Insufficient documentation

## 2019-03-03 DIAGNOSIS — R519 Headache, unspecified: Secondary | ICD-10-CM | POA: Insufficient documentation

## 2019-03-03 MED ORDER — METOCLOPRAMIDE HCL 5 MG/ML IJ SOLN
10.0000 mg | Freq: Once | INTRAMUSCULAR | Status: AC
  Administered 2019-03-03: 10:00:00 10 mg via INTRAVENOUS
  Filled 2019-03-03: qty 2

## 2019-03-03 MED ORDER — SODIUM CHLORIDE 0.9 % IV BOLUS
500.0000 mL | Freq: Once | INTRAVENOUS | Status: AC
  Administered 2019-03-03: 10:00:00 500 mL via INTRAVENOUS

## 2019-03-03 MED ORDER — DIPHENHYDRAMINE HCL 50 MG/ML IJ SOLN
12.5000 mg | Freq: Once | INTRAMUSCULAR | Status: AC
  Administered 2019-03-03: 10:00:00 12.5 mg via INTRAVENOUS
  Filled 2019-03-03: qty 1

## 2019-03-03 NOTE — Discharge Instructions (Addendum)
You have been diagnosed today with Headache.  At this time there does not appear to be the presence of an emergent medical condition, however there is always the potential for conditions to change. Please read and follow the below instructions.  Please return to the Emergency Department immediately for any new or worsening symptoms. Please be sure to follow up with your Primary Care Provider within one week regarding your visit today; please call their office to schedule an appointment even if you are feeling better for a follow-up visit. Please call the number attached to [facility] health community health and wellness on your discharge paperwork to establish a local primary care provider.  As requested you have been given referral to a local neurologist to you may call for follow-up as needed.  Get help right away if: Your headache gets very bad quickly. Your headache gets worse after a lot of physical activity. You keep throwing up. You have a stiff neck. You have trouble seeing. You have trouble speaking. You have pain in the eye or ear. Your muscles are weak or you lose muscle control. You lose your balance or have trouble walking. You feel like you will pass out (faint) or you pass out. You are mixed up (confused). You have a seizure. You have any new/concerning or worsening of symptoms  Please read the additional information packets attached to your discharge summary.  Do not take your medicine if  develop an itchy rash, swelling in your mouth or lips, or difficulty breathing; call 911 and seek immediate emergency medical attention if this occurs.  Note: Portions of this text may have been transcribed using voice recognition software. Every effort was made to ensure accuracy; however, inadvertent computerized transcription errors may still be present.

## 2019-03-03 NOTE — ED Provider Notes (Signed)
[facility] [facility] EMERGENCY DEPARTMENT Provider Note   CSN: [id number] Arrival date & time: 03/03/19  7106     History Chief Complaint  Patient presents with  . Migraine    Nichole Stone is a 44 y.o. female who reports only past medical history of fibromyalgia and migraines otherwise healthy, no daily medication use.  Patient presents today for right-sided headache x3-4 days, constant but waxing and waning in intensity throbbing pain worsened with light improved with rest and pulling the hair on the right side of her head, pain is nonradiating.  She is attempted Tylenol and ibuprofen with minimal relief of pain.  Patient reports extensive history of migraines in the past that feel similar in nature to her headache today, she denies any abnormal features of her migraine today.  She reports that she recently moved to this area from [facility] and is requesting a primary care provider and a migraine cocktail today.  Of note patient reports 1 episode of a small amount of emesis this morning after taking "2 Tylenol on an empty stomach".  She reports that she has vomited with headaches in the past, she reports that she does not feel headache today because of vomiting but instead taking medications without eating first.  She denies any fall/injury, fever/chills, vision changes, difficulty speaking, sore throat, neck pain/stiffness, cough, chest pain/shortness of breath, abdominal pain or any additional concerns.  HPI     Past Medical History:  Diagnosis Date  . Diabetes mellitus without complication ([place])    states was borderline but doesnt have it any more  . Fibromyalgia   . Hypertension    was taken off her meds she states by dr    There are no problems to display for this patient.   Past Surgical History:  Procedure Laterality Date  . CHOLECYSTECTOMY    . TONSILLECTOMY    . TUBAL LIGATION       OB History   No obstetric history on file.      Family History  Problem Relation Age of Onset  . Diabetes Mother   . Hypertension Mother   . Cirrhosis Mother   . Cancer Mother   . Diabetes Father   . Hypertension Father   . Parkinson's disease Father   . Dementia Father     Social History   Tobacco Use  . Smoking status: Current Every Day Smoker    Packs/day: 0.25    Years: 10.00    Pack years: 2.50    Types: Cigarettes  . Smokeless tobacco: Never Used  Substance Use Topics  . Alcohol use: Yes  . Drug use: Never    Home Medications Prior to Admission medications   Not on File    Allergies    Lisinopril  Review of Systems   Review of Systems  Constitutional: Negative.  Negative for chills and fever.  HENT: Negative.  Negative for ear pain and sore throat.   Eyes: Negative.  Negative for visual disturbance.  Respiratory: Negative.  Negative for cough and shortness of breath.   Cardiovascular: Negative.  Negative for chest pain.  Gastrointestinal: Positive for vomiting (1 episode after 2 Tylenol today). Negative for abdominal pain, diarrhea and nausea.  Musculoskeletal: Negative.  Negative for neck pain and neck stiffness.  Skin: Negative.  Negative for wound.  Neurological: Positive for headaches. Negative for dizziness, syncope, speech difficulty, weakness and numbness.    Physical Exam Updated Vital Signs BP (!) 145/80 (BP Location: Right Arm)  Pulse 71   Temp 98.7 F (37.1 C) (Oral)   Resp 18   Ht 5\' 5"  (1.651 m)   Wt 129 kg   SpO2 100%   BMI 47.33 kg/m   Physical Exam Constitutional:      General: She is not in acute distress.    Appearance: Normal appearance. She is well-developed. She is not ill-appearing or diaphoretic.  HENT:     Head: Normocephalic and atraumatic.     Jaw: There is normal jaw occlusion. No trismus.     Right Ear: Tympanic membrane and external ear normal.     Left Ear: Tympanic membrane and external ear normal.     Nose: Nose normal. No rhinorrhea.      Mouth/Throat:     Mouth: Mucous membranes are moist.     Pharynx: Oropharynx is clear. Uvula midline.  Eyes:     General: Vision grossly intact. Gaze aligned appropriately.     Extraocular Movements: Extraocular movements intact.     Conjunctiva/sclera: Conjunctivae normal.     Pupils: Pupils are equal, round, and reactive to light.     Comments: Visual Fields grossly intact bilaterally Extraocular motion intact without pain  Neck:     Trachea: Trachea and phonation normal. No tracheal deviation.  Pulmonary:     Effort: Pulmonary effort is normal. No respiratory distress.  Abdominal:     General: There is no distension.     Palpations: Abdomen is soft.     Tenderness: There is no abdominal tenderness. There is no guarding or rebound.  Musculoskeletal:        General: Normal range of motion.     Cervical back: Full passive range of motion without pain, normal range of motion and neck supple.  Skin:    General: Skin is warm and dry.  Neurological:     Mental Status: She is alert.     GCS: GCS eye subscore is 4. GCS verbal subscore is 5. GCS motor subscore is 6.     Comments: Mental Status: Alert, oriented, thought content appropriate, able to give a coherent history. Speech fluent without evidence of aphasia. Able to follow 2 step commands without difficulty. Cranial Nerves: II: Peripheral visual fields grossly normal, pupils equal, round, reactive to light III,IV, VI: ptosis not present, extra-ocular motions intact bilaterally V,VII: smile symmetric, eyebrows raise symmetric, facial light touch sensation equal VIII: hearing grossly normal to voice X: uvula elevates symmetrically XI: bilateral shoulder shrug symmetric and strong XII: midline tongue extension without fassiculations Motor: Normal tone. 5/5 strength in upper and lower extremities bilaterally including strong and equal grip strength and dorsiflexion/plantar flexion Sensory: Sensation intact to light touch in  all extremities.Negative Romberg.  Cerebellar: normal finger-to-nose with bilateral upper extremities. Normal heel-to -shin balance bilaterally of the lower extremity. No pronator drift.  Gait: normal gait and balance CV: distal pulses palpable throughout  Psychiatric:        Behavior: Behavior normal.     ED Results / Procedures / Treatments   Labs (all labs ordered are listed, but only abnormal results are displayed) Labs Reviewed - No data to display  EKG None  Radiology No results found.  Procedures Procedures (including critical care time)  Medications Ordered in ED Medications - No data to display  ED Course  I have reviewed the triage vital signs and the nursing notes.  Pertinent labs & imaging results that were available during my care of the patient were reviewed by me and considered in  my medical decision making (see chart for details).    MDM Rules/Calculators/A&P                      Chart review shows recent urgent care visit 6 days ago at which time she had left lower quadrant pain, this resolved patient has no further abdominal concerns. - Oria Klimas is a 44 y.o. female who presents to ED for right-sided headache x3-4 days. Patient states that their headache is consistent with their typical headache in the past and denies abnormal feature.  Physical examination is reassuring, no focal neuro deficits or meningeal signs.  She is well-appearing in no acute distress.  No history of injury, sudden onset, syncope, neck stiffness, recent pregnancy or pain about the temporal or cervical arteries, additionally no neurologic complaints, clotting risk factors or any additional red flag symptoms.  There is no indication for imaging this time.  Will give migraine cocktail, IV fluids, Reglan, Benadryl and reassess. - - 11:20 AM: Patient reassessed resting comfortably in bed no acute distress.  Sleeping, easily arousable to voice.  She reports improvement of her  headache today and is requesting discharge.  On reevaluation she is well-appearing and in no acute distress, no neurologic complaints.  She reports her she has a ride home from the ED today will be coming shortly.  She has no further questions or concerns.  She is again requesting referrals to a PCP and neurologist which will be provided.  Return precautions reviewed including development of fever, nausea/vomiting or neurologic symptoms, vision changes, confusion, lethargy, difficulty speaking/walking, or other new/worsening/concerning symptoms. Patient states understanding of return precautions.  At this time there does not appear to be any evidence of an acute emergency medical condition and the patient appears stable for discharge with appropriate outpatient follow up. Diagnosis was discussed with patient who verbalizes understanding of care plan and is agreeable to discharge. I have discussed return precautions with patient who verbalizes understanding of return precautions. Patient encouraged to follow-up with their PCP. All questions answered. Patient has been discharged in good condition.  Case discussed with Dr. Lynelle Doctor who agrees with discharge.  Note: Portions of this report may have been transcribed using voice recognition software. Every effort was made to ensure accuracy; however, inadvertent computerized transcription errors may still be present. Final Clinical Impression(s) / ED Diagnoses Final diagnoses:  None    Rx / DC Orders ED Discharge Orders    None       Elizabeth Palau 03/03/19 1135    Linwood Dibbles, MD 03/05/19 [phone]

## 2019-03-03 NOTE — ED Triage Notes (Signed)
H/A x 3-4 days had some vomiting no sob  States  Has no PCP in this area just moved from [place]

## 2019-03-08 ENCOUNTER — Emergency Department (HOSPITAL_BASED_OUTPATIENT_CLINIC_OR_DEPARTMENT_OTHER)
Admission: EM | Admit: 2019-03-08 | Discharge: 2019-03-09 | Disposition: A | Discharge status: Home / Self Care | Payer: Self-pay | Attending: Emergency Medicine | Admitting: Emergency Medicine

## 2019-03-08 ENCOUNTER — Encounter (HOSPITAL_BASED_OUTPATIENT_CLINIC_OR_DEPARTMENT_OTHER): Payer: Self-pay

## 2019-03-08 ENCOUNTER — Other Ambulatory Visit: Payer: Self-pay

## 2019-03-08 DIAGNOSIS — Z5321 Procedure and treatment not carried out due to patient leaving prior to being seen by health care provider: Secondary | ICD-10-CM | POA: Insufficient documentation

## 2019-03-08 DIAGNOSIS — R519 Headache, unspecified: Secondary | ICD-10-CM | POA: Insufficient documentation

## 2019-03-08 NOTE — ED Triage Notes (Signed)
Pt c/o HA x "over a week"-was seen here for same-states HA is worse-referred to neuro-states she has not able to f/u due to she is self pay-

## 2019-03-09 NOTE — ED Notes (Signed)
PT called 3 times more than 10 min apart with no answer. This tech walked the downstairs halls and lobby and did not see pt.

## 2019-03-31 ENCOUNTER — Encounter: Payer: Self-pay | Admitting: Neurology

## 2019-04-17 ENCOUNTER — Emergency Department (HOSPITAL_BASED_OUTPATIENT_CLINIC_OR_DEPARTMENT_OTHER)
Admission: EM | Admit: 2019-04-17 | Discharge: 2019-04-17 | Disposition: A | Discharge status: Home / Self Care | Payer: Self-pay | Attending: Emergency Medicine | Admitting: Emergency Medicine

## 2019-04-17 ENCOUNTER — Encounter (HOSPITAL_BASED_OUTPATIENT_CLINIC_OR_DEPARTMENT_OTHER): Payer: Self-pay | Admitting: Emergency Medicine

## 2019-04-17 ENCOUNTER — Other Ambulatory Visit: Payer: Self-pay

## 2019-04-17 DIAGNOSIS — Z87891 Personal history of nicotine dependence: Secondary | ICD-10-CM | POA: Insufficient documentation

## 2019-04-17 DIAGNOSIS — I10 Essential (primary) hypertension: Secondary | ICD-10-CM | POA: Insufficient documentation

## 2019-04-17 DIAGNOSIS — Z79899 Other long term (current) drug therapy: Secondary | ICD-10-CM | POA: Insufficient documentation

## 2019-04-17 DIAGNOSIS — E119 Type 2 diabetes mellitus without complications: Secondary | ICD-10-CM | POA: Insufficient documentation

## 2019-04-17 DIAGNOSIS — H9201 Otalgia, right ear: Secondary | ICD-10-CM | POA: Insufficient documentation

## 2019-04-17 HISTORY — DX: Obesity, unspecified: E66.9

## 2019-04-17 NOTE — ED Provider Notes (Signed)
[facility] EMERGENCY DEPARTMENT Provider Note  CSN: [id number] Arrival date & time: 04/17/19 1020    History Chief Complaint  Patient presents with  . Otalgia    HPI  Nichole Stone is a 44 y.o. female who presents for evaluation of right ear pain.  Patient reports he has had ear problems since she was young including multiple ear infections and tubes.  She does not hear well out of that ear at baseline but reports over the last several days she has had nasal congestion consistent with seasonal allergies with increasing pain and muffled hearing in the right ear.  She denies any fever or drainage.  She has taken over-the-counter antihistamines without relief.   Past Medical History:  Diagnosis Date  . Diabetes mellitus without complication ([facility])    states was borderline but doesnt have it any more  . Fibromyalgia   . Hypertension    was taken off her meds she states by dr  . Obesity     Past Surgical History:  Procedure Laterality Date  . CHOLECYSTECTOMY    . HAND SURGERY    . TONSILLECTOMY    . TUBAL LIGATION      Family History  Problem Relation Age of Onset  . Diabetes Mother   . Hypertension Mother   . Cirrhosis Mother   . Cancer Mother   . Diabetes Father   . Hypertension Father   . Parkinson's disease Father   . Dementia Father     Social History   Tobacco Use  . Smoking status: Former Smoker    Packs/day: 0.25    Years: 10.00    Pack years: 2.50    Types: Cigarettes  . Smokeless tobacco: Never Used  Substance Use Topics  . Alcohol use: Yes    Comment: occ  . Drug use: Never     Home Medications Prior to Admission medications   Medication Sig Start Date End Date Taking? Authorizing Provider  TURMERIC PO Take by mouth.   Yes [provider]     Allergies    Lisinopril   Review of Systems   Review of Systems  Constitutional: Negative for fever.  HENT: Positive for congestion, ear pain, hearing loss and  rhinorrhea. Negative for sore throat.   Respiratory: Negative for cough and shortness of breath.   Cardiovascular: Negative for chest pain.  Gastrointestinal: Negative for abdominal pain, diarrhea, nausea and vomiting.  Genitourinary: Negative for dysuria.  Musculoskeletal: Negative for myalgias.  Skin: Negative for rash.  Neurological: Negative for headaches.  Psychiatric/Behavioral: Negative for behavioral problems.     Physical Exam BP (!) 152/51 (BP Location: Right Arm)   Pulse 67   Temp 98.2 F (36.8 C) (Oral)   Resp 16   Ht 5\' 5"  (1.651 m)   Wt 131.1 kg   LMP 03/17/2019   SpO2 99%   BMI 48.11 kg/m   Physical Exam HENT:     Head: Normocephalic.     Left Ear: Tympanic membrane normal.     Ears:     Comments: Middle ear effusion on the right without signs of infection.    Nose: Nose normal.  Eyes:     Extraocular Movements: Extraocular movements intact.  Pulmonary:     Effort: Pulmonary effort is normal.  Musculoskeletal:        General: Normal range of motion.     Cervical back: Neck supple.  Skin:    Findings: No rash (on exposed skin).  Neurological:  Mental Status: She is alert and oriented to person, place, and time.  Psychiatric:        Mood and Affect: Mood normal.      ED Results / Procedures / Treatments   Labs (all labs ordered are listed, but only abnormal results are displayed) Labs Reviewed - No data to display  EKG None  Radiology No results found.  Procedures Procedures  Medications Ordered in the ED Medications - No data to display   ED Course  I have reviewed the triage vital signs and the nursing notes.  Pertinent labs & imaging results that were available during my care of the patient were reviewed by me and considered in my medical decision making (see chart for details).  Clinical Course as of Apr 17 1038  Mon Apr 17, 2019  1039 Patient with ear pain and a middle ear effusion likely due to seasonal allergies.  There  is no sign of infection.  She was counseled to get over-the-counter decongestant such as pseudoephedrine to help with her symptoms.  She will be referred to ENT for long-term management of her ear problem.   [CS]    Clinical Course User Index [CS] Truddie Hidden, MD    MDM Rules/Calculators/A&P MDM  Final Clinical Impression(s) / ED Diagnoses Final diagnoses:  Right ear pain    Rx / DC Orders ED Discharge Orders    None       Truddie Hidden, MD 04/17/19 1040

## 2019-04-17 NOTE — ED Triage Notes (Signed)
Pain in right ear for a few days.  Sts it feels like there is fluid in there.  Dizziness when she bends over.

## 2019-04-17 NOTE — ED Notes (Signed)
ED Provider at bedside. 

## 2019-04-26 ENCOUNTER — Other Ambulatory Visit: Payer: Self-pay

## 2019-04-27 ENCOUNTER — Ambulatory Visit (INDEPENDENT_AMBULATORY_CARE_PROVIDER_SITE_OTHER): Payer: Self-pay | Admitting: Obstetrics and Gynecology

## 2019-04-27 ENCOUNTER — Encounter: Payer: Self-pay | Admitting: Obstetrics and Gynecology

## 2019-04-27 VITALS — BP 122/78 | Ht 66.0 in | Wt 288.0 lb

## 2019-04-27 DIAGNOSIS — R1032 Left lower quadrant pain: Secondary | ICD-10-CM

## 2019-04-27 DIAGNOSIS — N63 Unspecified lump in unspecified breast: Secondary | ICD-10-CM

## 2019-04-27 DIAGNOSIS — Z1151 Encounter for screening for human papillomavirus (HPV): Secondary | ICD-10-CM

## 2019-04-27 DIAGNOSIS — Z124 Encounter for screening for malignant neoplasm of cervix: Secondary | ICD-10-CM

## 2019-04-27 DIAGNOSIS — Z01419 Encounter for gynecological examination (general) (routine) without abnormal findings: Secondary | ICD-10-CM

## 2019-04-27 MED ORDER — NORGESTIMATE-ETH ESTRADIOL 0.25-35 MG-MCG PO TABS: 1.0000 | ORAL_TABLET | Freq: Every day | ORAL | 4 refills | Status: DC

## 2019-04-27 NOTE — Patient Instructions (Addendum)
Please start birth control pill and take this continuously, discard the placebo week of the birth control pill pack and go directly to the next active pill week in the next pill pack The day or 2 before you expect the left lower quadrant pain to begin, take ibuprofen 600 mg with food 4 times per day, for 5 days. Let us know if the pain is not improving in the next 3 months we will bring you in to discuss further management and work-up Endometriosis  Endometriosis is a condition in which the tissue that lines the uterus (endometrium) grows outside of its normal location. The tissue may grow in many locations close to the uterus, but it commonly grows on the ovaries, fallopian tubes, vagina, or bowel. When the uterus sheds the endometrium every menstrual cycle, there is bleeding wherever the endometrial tissue is located. This can cause pain because blood is irritating to tissues that are not normally exposed to it. What are the causes? The cause of endometriosis is not known. What increases the risk? You may be more likely to develop endometriosis if you:  Have a family history of endometriosis.  Have never given birth.  Started your period at age 23 or younger.  Have high levels of estrogen in your body.  Were exposed to a certain medicine (diethylstilbestrol) before you were born (in utero).  Had low birth weight.  Were born as a twin, triplet, or other multiple.  Have a BMI of less than 25. BMI is an estimate of body fat and is calculated from height and weight. What are the signs or symptoms? Often, there are no symptoms of this condition. If you do have symptoms, they may:  Vary depending on where your endometrial tissue is growing.  Occur during your menstrual period (most common) or midcycle.  Come and go, or you may go months with no symptoms at all.  Stop with menopause. Symptoms may include:  Pain in the back or abdomen.  Heavier bleeding during periods.  Pain  during sex.  Painful bowel movements.  Infertility.  Pelvic pain.  Bleeding more than once a month. How is this diagnosed? This condition is diagnosed based on your symptoms and a physical exam. You may have tests, such as:  Blood tests and urine tests. These may be done to help rule out other possible causes of your symptoms.  Ultrasound, to look for abnormal tissues.  An X-ray of the lower bowel (barium enema).  An ultrasound that is done through the vagina (transvaginally).  CT scan.  MRI.  Laparoscopy. In this procedure, a lighted, pencil-sized instrument called a laparoscope is inserted into your abdomen through an incision. The laparoscope allows your health care provider to look at the organs inside your body and check for abnormal tissue to confirm the diagnosis. If abnormal tissue is found, your health care provider may remove a small piece of tissue (biopsy) to be examined under a microscope. How is this treated? Treatment for this condition may include:  Medicines to relieve pain, such as NSAIDs.  Hormone therapy. This involves using artificial (synthetic) hormones to reduce endometrial tissue growth. Your health care provider may recommend using a hormonal form of birth control, or other medicines.  Surgery. This may be done to remove abnormal endometrial tissue. ? In some cases, tissue may be removed using a laparoscope and a laser (laparoscopic laser treatment). ? In severe cases, surgery may be done to remove the fallopian tubes, uterus, and ovaries (hysterectomy). Follow these  instructions at home:  Take over-the-counter and prescription medicines only as told by your health care provider.  Do not drive or use heavy machinery while taking prescription pain medicine.  Try to avoid activities that cause pain, including sexual activity.  Keep all follow-up visits as told by your health care provider. This is important. Contact a health care provider if:  You  have pain in the area between your hip bones (pelvic area) that occurs: ? Before, during, or after your period. ? In between your period and gets worse during your period. ? During or after sex. ? With bowel movements or urination, especially during your period.  You have problems getting pregnant.  You have a fever. Get help right away if:  You have severe pain that does not get better with medicine.  You have severe nausea and vomiting, or you cannot eat without vomiting.  You have pain that affects only the lower, right side of your abdomen.  You have abdominal pain that gets worse.  You have abdominal swelling.  You have blood in your stool. This information is not intended to replace advice given to you by your health care provider. Make sure you discuss any questions you have with your health care provider. Document Revised: 12/11/2016 Document Reviewed: 06/01/2015 Elsevier Patient Education  2020 [employer].

## 2019-04-27 NOTE — Progress Notes (Signed)
Nichole Stone 12/23/1975 [id number]  SUBJECTIVE:  44 y.o. G2P2 female new patient here for annual routine gynecologic exam and Pap smear. She also has been having LLQ pain for about a year, starting in June or July 2020.  Pain is intermittent and seems to be worse nearing her menstrual cycle.  No dyspareunia.  Pain is severe and can get bad enough to greatly affect her daily functions.  She says she has been to multiple other doctor visits to address this pain.   Treated for presumed diverticulitis which did not help.  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  Uterus was noted to be unremarkable and there are no suspicious adnexal masses.  Bowel movements have been regular and pass easily with no pain.  Prior SVD x 2.  Not currently on hormonal contraception as she had a previous tubal ligation.  She was on depo provera initially after having her son but then she got pregnant.  She had a tubal ligation (clips) after she had her daughter.  Pain improves a little with Tylenol and pain medicine (hydrocodone or tramadol) given at her other visits and other specialties.   Her menstrual history includes light periods with cramps, alternating with heavier periods and no cramps the other months.  Her period usually lasts 3-4 days, regular and monthly.    Current Outpatient Medications  Medication Sig Dispense Refill  . TURMERIC PO Take by mouth.    . norgestimate-ethinyl estradiol (ORTHO-CYCLEN) 0.25-35 MG-MCG tablet Take 1 tablet by mouth daily. Continuous administration. Discard placebo week and start next active pill week right away 4 Package 4   No current facility-administered medications for this visit.   Allergies: Lisinopril  Patient's last menstrual period was 04/24/2019.  Past medical history,surgical history, problem list, medications, allergies, family history and social history were all reviewed  and documented as reviewed in the EPIC chart.  ROS:  Feeling well. No dyspnea or chest pain on exertion.  No abdominal pain, change in bowel habits, black or bloody stools.  No urinary tract symptoms. GYN ROS: normal menses, no abnormal bleeding, pelvic pain or discharge, no breast pain or new or enlarging lumps on self exam. No neurological complaints.   OBJECTIVE:  BP 122/78   Ht 5\' 6"  (1.676 m)   Wt 288 lb (130.6 kg)   LMP 04/24/2019   BMI 46.48 kg/m  The patient appears well, alert, oriented x 3, in no distress. ENT normal.  Neck supple. No cervical or supraclavicular adenopathy or thyromegaly.  Lungs are clear, good air entry, no wheezes, rhonchi or rales. S1 and S2 normal, no murmurs, regular rate and rhythm.  Abdomen soft without tenderness, guarding, mass or organomegaly.  Neurological is normal, no focal findings.  BREAST EXAM: left breast normal without mass, skin or nipple changes or axillary nodes, right breast with palpable marble size lump mobile and nontender at the left outer quadrant 2 fingerbreadths from the areola, in addition to a smaller pea-sized lump that is mildly tender just lateral to that  PELVIC EXAM: VULVA: normal appearing vulva with no masses, tenderness or lesions, VAGINA: normal appearing vagina with normal color and discharge, no lesions, scant menses in vaginal vault, CERVIX: normal appearing cervix without discharge or lesions, no active menses, UTERUS: uterus is normal size, shape, consistency and nontender, ADNEXA: normal adnexa in size, nontender and no masses,  PAP: Pap smear done today, thin-prep method  Chaperone: Kennon Portela present during the examination  ASSESSMENT:  44 y.o. G2P2 here for annual gynecologic exam, findings of left breast masses on exam, cyclic pelvic pain likely consistent with endometriosis  PLAN:   1.  Cyclic pelvic pain.  We discussed the gynecologic differential diagnosis to include endometriosis versus mittelschmerz.  She  had negative CT imaging that excluded multiple other potential causes in 10/2018.  I discussed endometriosis and the pathophysiology in detail with the patient today.  Endometriosis is ectopic implantation of endometrial tissue and therefore it is fueled by estrogen levels in the body.  I discussed the disease is clinically suspected and diagnosis is only able to be confirmed based off of visualization and pathologic analysis of excised and affected tissues if possible after surgery.  Endometriosis can also cause pain with defecation and dyspareunia, which are symptoms she is not currently having.  I discussed empiric management based off of clinical suspicion that is only confirmed by surgical diagnosis, which is only pursued if conservative therapies fail. Some of the treatments for endometriosis range from various birth control options, Lupron injection, and surgery.  The first-line treatment for endometriosis is with hormonal contraception, which would include oral contraceptive pills or Depo-Provera injection, with the goal of reducing ovulation and swings in estrogen levels.  She would like to try combined OCPs so I wrote her a prescription for Sprintec to take continuously.  Potential side effects of OCPs are discussed.  Risks of OCP use to include thrombotic disease such as heart attack, stroke, DVT, PE are reviewed.  She denies any history of hypertension, liver cysts, migraine headaches with aura symptoms.  I would like her to take the OCPs for at least the next 3 months and to let us know if her pain is not improving.  I also recommended taking scheduled ibuprofen starting the day before she expects the pain begin each month.  Endometriosis patient information is provided to her at checkout. 2. Pap smear collected today.  3. Contraception.  Previous tubal ligation.  Adding Sprintec as above. 4.  Right breast masses on exam.  I encouraged her to get her routine screening mammogram as this is overdue,  and will also have office staff help her coordinate a diagnostic mammogram of the right breast.   Return in 3 months or sooner, prn.  Theresia Majors MD, FACOG  04/27/19

## 2019-04-28 ENCOUNTER — Telehealth: Payer: Self-pay | Admitting: *Deleted

## 2019-04-28 DIAGNOSIS — N63 Unspecified lump in unspecified breast: Secondary | ICD-10-CM

## 2019-04-28 NOTE — Telephone Encounter (Signed)
-----   Message from Theresia Majors, MD sent at 04/27/2019  1:27 PM EDT ----- Regarding: Diagnostic right mammogram Hello, I saw this patient today for a routine annual exam and she is noted to have some palpable masses on her right breast in the lower outer quadrant, I would like her to get a screening mammogram and also a diagnostic right breast mammogram.  Thank you

## 2019-04-28 NOTE — Telephone Encounter (Signed)
Orders placed at the breast center they will call to schedule. 

## 2019-05-01 ENCOUNTER — Telehealth: Payer: Self-pay

## 2019-05-01 LAB — PAP IG AND HPV HIGH-RISK: HPV DNA High Risk: NOT DETECTED

## 2019-05-01 NOTE — Telephone Encounter (Signed)
Telephoned patient at home number. Left a voice message with [facility] contact information. 

## 2019-05-01 NOTE — Telephone Encounter (Signed)
Patient has no health insurance message given to [facility] to help schedule. Spoke with Gunnar Fusi and she will call patient to discuss.

## 2019-05-08 ENCOUNTER — Other Ambulatory Visit: Payer: Self-pay

## 2019-05-08 DIAGNOSIS — N644 Mastodynia: Secondary | ICD-10-CM

## 2019-05-08 DIAGNOSIS — N6310 Unspecified lump in the right breast, unspecified quadrant: Secondary | ICD-10-CM

## 2019-05-08 NOTE — Telephone Encounter (Signed)
Patient scheduled on 05/25/19

## 2019-05-11 ENCOUNTER — Encounter (HOSPITAL_BASED_OUTPATIENT_CLINIC_OR_DEPARTMENT_OTHER): Payer: Self-pay | Admitting: *Deleted

## 2019-05-11 ENCOUNTER — Other Ambulatory Visit: Payer: Self-pay

## 2019-05-11 ENCOUNTER — Emergency Department (HOSPITAL_BASED_OUTPATIENT_CLINIC_OR_DEPARTMENT_OTHER)
Admission: EM | Admit: 2019-05-11 | Discharge: 2019-05-11 | Disposition: A | Discharge status: Home / Self Care | Payer: Self-pay | Attending: Emergency Medicine | Admitting: Emergency Medicine

## 2019-05-11 DIAGNOSIS — R5381 Other malaise: Secondary | ICD-10-CM | POA: Insufficient documentation

## 2019-05-11 DIAGNOSIS — I10 Essential (primary) hypertension: Secondary | ICD-10-CM | POA: Insufficient documentation

## 2019-05-11 DIAGNOSIS — R531 Weakness: Secondary | ICD-10-CM | POA: Insufficient documentation

## 2019-05-11 DIAGNOSIS — R110 Nausea: Secondary | ICD-10-CM | POA: Insufficient documentation

## 2019-05-11 DIAGNOSIS — Z87891 Personal history of nicotine dependence: Secondary | ICD-10-CM | POA: Insufficient documentation

## 2019-05-11 DIAGNOSIS — Z20822 Contact with and (suspected) exposure to covid-19: Secondary | ICD-10-CM | POA: Insufficient documentation

## 2019-05-11 DIAGNOSIS — M791 Myalgia, unspecified site: Secondary | ICD-10-CM | POA: Insufficient documentation

## 2019-05-11 DIAGNOSIS — R630 Anorexia: Secondary | ICD-10-CM | POA: Insufficient documentation

## 2019-05-11 LAB — CBC WITH DIFFERENTIAL/PLATELET
Abs Immature Granulocytes: 0.03 10*3/uL (ref 0.00–0.07)
Basophils Absolute: 0.1 10*3/uL (ref 0.0–0.1)
Basophils Relative: 1 %
Eosinophils Absolute: 0.2 10*3/uL (ref 0.0–0.5)
Eosinophils Relative: 2 %
HCT: 37.4 % (ref 36.0–46.0)
Hemoglobin: 12.6 g/dL (ref 12.0–15.0)
Immature Granulocytes: 0 %
Lymphocytes Relative: 28 %
Lymphs Abs: 2.6 10*3/uL (ref 0.7–4.0)
MCH: 27.7 pg (ref 26.0–34.0)
MCHC: 33.7 g/dL (ref 30.0–36.0)
MCV: 82.2 fL (ref 80.0–100.0)
Monocytes Absolute: 0.5 10*3/uL (ref 0.1–1.0)
Monocytes Relative: 6 %
Neutro Abs: 5.9 10*3/uL (ref 1.7–7.7)
Neutrophils Relative %: 63 %
Platelets: 360 10*3/uL (ref 150–400)
RBC: 4.55 MIL/uL (ref 3.87–5.11)
RDW: 14.2 % (ref 11.5–15.5)
WBC: 9.3 10*3/uL (ref 4.0–10.5)
nRBC: 0 % (ref 0.0–0.2)

## 2019-05-11 LAB — URINALYSIS, ROUTINE W REFLEX MICROSCOPIC
Bilirubin Urine: NEGATIVE
Glucose, UA: NEGATIVE mg/dL
Hgb urine dipstick: NEGATIVE
Ketones, ur: NEGATIVE mg/dL
Leukocytes,Ua: NEGATIVE
Nitrite: NEGATIVE
Protein, ur: NEGATIVE mg/dL
Specific Gravity, Urine: 1.02 (ref 1.005–1.030)
pH: 6 (ref 5.0–8.0)

## 2019-05-11 LAB — LIPASE, BLOOD: Lipase: 26 U/L (ref 11–51)

## 2019-05-11 LAB — COMPREHENSIVE METABOLIC PANEL
ALT: 12 U/L (ref 0–44)
AST: 12 U/L — ABNORMAL LOW (ref 15–41)
Albumin: 3.5 g/dL (ref 3.5–5.0)
Alkaline Phosphatase: 65 U/L (ref 38–126)
Anion gap: 7 (ref 5–15)
BUN: 11 mg/dL (ref 6–20)
CO2: 23 mmol/L (ref 22–32)
Calcium: 8.2 mg/dL — ABNORMAL LOW (ref 8.9–10.3)
Chloride: 109 mmol/L (ref 98–111)
Creatinine, Ser: 0.63 mg/dL (ref 0.44–1.00)
GFR calc Af Amer: >60 mL/min (ref >60)
GFR calc non Af Amer: >60 mL/min (ref >60)
Glucose, Bld: 126 mg/dL — ABNORMAL HIGH (ref 70–99)
Potassium: 3.9 mmol/L (ref 3.5–5.1)
Sodium: 139 mmol/L (ref 135–145)
Total Bilirubin: 0.5 mg/dL (ref 0.3–1.2)
Total Protein: 7.5 g/dL (ref 6.5–8.1)

## 2019-05-11 LAB — PREGNANCY, URINE: Preg Test, Ur: NEGATIVE

## 2019-05-11 LAB — RESPIRATORY PANEL BY RT PCR (FLU A&B, COVID)
Influenza A by PCR: NEGATIVE
Influenza B by PCR: NEGATIVE
SARS Coronavirus 2 by RT PCR: NEGATIVE

## 2019-05-11 MED ORDER — SODIUM CHLORIDE 0.9 % IV BOLUS
1000.0000 mL | Freq: Once | INTRAVENOUS | Status: AC
  Administered 2019-05-11: 1000 mL via INTRAVENOUS

## 2019-05-11 MED ORDER — NAPROXEN 500 MG PO TABS: 500.0000 mg | ORAL_TABLET | Freq: Two times a day (BID) | ORAL | 0 refills | Status: DC

## 2019-05-11 MED ORDER — KETOROLAC TROMETHAMINE 30 MG/ML IJ SOLN
30.0000 mg | Freq: Once | INTRAMUSCULAR | Status: AC
  Administered 2019-05-11: 13:00:00 30 mg via INTRAVENOUS
  Filled 2019-05-11: qty 1

## 2019-05-11 MED ORDER — ONDANSETRON 4 MG PO TBDP: ORAL_TABLET | ORAL | 0 refills | Status: DC

## 2019-05-11 MED ORDER — ONDANSETRON HCL 4 MG/2ML IJ SOLN
4.0000 mg | Freq: Once | INTRAMUSCULAR | Status: AC
  Administered 2019-05-11: 4 mg via INTRAVENOUS
  Filled 2019-05-11: qty 2

## 2019-05-11 NOTE — ED Triage Notes (Signed)
Presents with weakness today, states appetite is poor, not eating as much

## 2019-05-11 NOTE — ED Notes (Signed)
Patient denies pain and is resting comfortably.  

## 2019-05-11 NOTE — Discharge Instructions (Signed)
Your work-up today is very reassuring, this may be related to new birth control medication.  Please call to discuss with your gynecologist.  In the meantime you can treat nausea with Zofran, and take naproxen to help with body aches.  If you develop fevers, or any other new or worsening symptoms you can return to the ED for further evaluation.

## 2019-05-11 NOTE — ED Provider Notes (Signed)
[facility] EMERGENCY DEPARTMENT Provider Note   CSN: [id number] Arrival date & time: 05/11/19  1014     History Chief Complaint  Patient presents with  . Weakness    Nichole Stone is a 44 y.o. female.  Nichole Stone is a 44 y.o. female with a history of hypertension, fibromyalgia, and obesity, who presents to the emergency department today reporting weakness, general malaise, nausea and poor appetite.  She states symptoms have been present and persistent for about 3 days.  She states she started to just feel weak and achy all over, and then developed nausea.  She states she has not vomited but she has had no appetite over the past few days and has had very little to eat or drink.  She denies fevers or chills.  Denies any cough, rhinorrhea, sore throat, chest pain or shortness of breath.  No abdominal pain associated with nausea no diarrhea or constipation.  She denies any urinary symptoms.  Denies any known sick contacts.  Has not had her Covid vaccine.  Does report that 1 week ago she was started on Sprintec birth control to help treat abdominal pain that her gynecologist thinks may be due to endometriosis, and she wonders if this new medication could be causing her symptoms.        Past Medical History:  Diagnosis Date  . Fibromyalgia   . Hypertension    was taken off her meds she states by dr  . Obesity     There are no problems to display for this patient.   Past Surgical History:  Procedure Laterality Date  . CHOLECYSTECTOMY    . HAND SURGERY    . TONSILLECTOMY    . TUBAL LIGATION       OB History    Gravida  2   Para  2   Term      Preterm      AB      Living  2     SAB      TAB      Ectopic      Multiple      Live Births              Family History  Problem Relation Age of Onset  . Diabetes Mother   . Hypertension Mother   . Cirrhosis Mother   . Cancer Mother        Liver  . Diabetes Father   . Hypertension  Father   . Parkinson's disease Father   . Dementia Father   . Kidney failure Sister   . Diabetes Sister     Social History   Tobacco Use  . Smoking status: Former Smoker    Packs/day: 0.25    Years: 10.00    Pack years: 2.50    Types: Cigarettes  . Smokeless tobacco: Never Used  Substance Use Topics  . Alcohol use: Yes    Comment: occ  . Drug use: Never    Home Medications Prior to Admission medications   Medication Sig Start Date End Date Taking? Authorizing Provider  naproxen (NAPROSYN) 500 MG tablet Take 1 tablet (500 mg total) by mouth 2 (two) times daily. 05/11/19   Dartha Lodge, PA-C  norgestimate-ethinyl estradiol (ORTHO-CYCLEN) 0.25-35 MG-MCG tablet Take 1 tablet by mouth daily. Continuous administration. Discard placebo week and start next active pill week right away 04/27/19   Theresia Majors, MD  ondansetron (ZOFRAN ODT) 4 MG disintegrating tablet 4mg  ODT q4  hours prn nausea/vomit 05/11/19   Dartha Lodge, PA-C  TURMERIC PO Take by mouth.    [provider]    Allergies    Lisinopril  Review of Systems   Review of Systems  Constitutional: Positive for appetite change and fatigue. Negative for chills and fever.  HENT: Negative for congestion, rhinorrhea and sore throat.   Respiratory: Negative for cough and shortness of breath.   Cardiovascular: Negative for chest pain.  Gastrointestinal: Positive for nausea. Negative for abdominal pain, diarrhea and vomiting.  Genitourinary: Negative for dysuria and frequency.  Musculoskeletal: Positive for myalgias. Negative for arthralgias.  Skin: Negative for color change and rash.  Neurological: Positive for weakness (Generalized). Negative for dizziness, syncope, light-headedness, numbness and headaches.  All other systems reviewed and are negative.   Physical Exam Updated Vital Signs BP (!) 149/90 (BP Location: Right Arm)   Pulse 79   Temp 98.6 F (37 C) (Oral)   Resp 18   Ht 5\' 6"  (1.676 m)   Wt  128.4 kg   LMP 04/24/2019   SpO2 98%   BMI 45.68 kg/m   Physical Exam Vitals and nursing note reviewed.  Constitutional:      General: She is not in acute distress.    Appearance: Normal appearance. She is well-developed. She is obese. She is not ill-appearing or diaphoretic.     Comments: Well-appearing and in no distress  HENT:     Head: Normocephalic and atraumatic.     Mouth/Throat:     Mouth: Mucous membranes are moist.     Pharynx: Oropharynx is clear.  Eyes:     General:        Right eye: No discharge.        Left eye: No discharge.  Cardiovascular:     Rate and Rhythm: Normal rate and regular rhythm.     Heart sounds: Normal heart sounds. No murmur. No friction rub. No gallop.   Pulmonary:     Effort: Pulmonary effort is normal. No respiratory distress.     Breath sounds: Normal breath sounds. No wheezing or rales.     Comments: Respirations equal and unlabored, patient able to speak in full sentences, lungs clear to auscultation bilaterally Abdominal:     General: Bowel sounds are normal. There is no distension.     Palpations: Abdomen is soft. There is no mass.     Tenderness: There is no abdominal tenderness. There is no guarding.     Comments: Abdomen soft, nondistended, nontender to palpation in all quadrants without guarding or peritoneal signs  Musculoskeletal:        General: No deformity.     Cervical back: Neck supple.     Right lower leg: No edema.     Left lower leg: No edema.  Skin:    General: Skin is warm and dry.     Capillary Refill: Capillary refill takes less than 2 seconds.  Neurological:     Mental Status: She is alert and oriented to person, place, and time.     Coordination: Coordination normal.     Comments: Speech is clear, able to follow commands Moves extremities without ataxia, coordination intact  Psychiatric:        Mood and Affect: Mood normal.        Behavior: Behavior normal.     ED Results / Procedures / Treatments    Labs (all labs ordered are listed, but only abnormal results are displayed) Labs Reviewed  COMPREHENSIVE METABOLIC  PANEL - Abnormal; Notable for the following components:      Result Value   Glucose, Bld 126 (*)    Calcium 8.2 (*)    AST 12 (*)    All other components within normal limits  RESPIRATORY PANEL BY RT PCR (FLU A&B, COVID)  CBC WITH DIFFERENTIAL/PLATELET  LIPASE, BLOOD  URINALYSIS, ROUTINE W REFLEX MICROSCOPIC  PREGNANCY, URINE    EKG None  Radiology No results found.  Procedures Procedures (including critical care time)  Medications Ordered in ED Medications  ondansetron (ZOFRAN) injection 4 mg (4 mg Intravenous Given 05/11/19 1125)  sodium chloride 0.9 % bolus 1,000 mL (0 mLs Intravenous Stopped 05/11/19 1242)  ketorolac (TORADOL) 30 MG/ML injection 30 mg (30 mg Intravenous Given 05/11/19 1322)    ED Course  I have reviewed the triage vital signs and the nursing notes.  Pertinent labs & imaging results that were available during my care of the patient were reviewed by me and considered in my medical decision making (see chart for details).    MDM Rules/Calculators/A&P                     43 year old female presents with 3 days of nausea, poor appetite, and generalized weakness and malaise.  The symptoms began after she started a new birth control medicine about a week ago to try and manage pain that her gynecologist thinks is due to endometriosis, this could be contributing but will also check basic lab work, and we will also check Covid test given generalized body aches and malaise with nausea.  Will give IV fluid bolus and Zofran.  I have personally ordered, reviewed and interpreted all lab work: CBC: No leukocytosis, normal hemoglobin CMP: Glucose 126, mild hypocalcemia of 8.2, no other significant electrolyte derangements, normal renal and liver function.   Lipase: WNL Urinalysis without any signs of infection.   Negative pregnancy Covid:  Negative  After IV fluids and Zofran nausea has improved, patient is able to tolerate fluids, still complains of body aches, but this improved significantly after dose of Toradol.  I do think that these could be related to starting new birth control medication and have asked the patient to discuss this with her gynecologist but in the meantime we will manage symptoms with Zofran and Naprosyn at home.  Strict return precautions discussed.  Patient expresses understanding and agreement with plan.  Discharged home in good condition.    Final Clinical Impression(s) / ED Diagnoses Final diagnoses:  Weakness  Nausea    Rx / DC Orders ED Discharge Orders         Ordered    ondansetron (ZOFRAN ODT) 4 MG disintegrating tablet     05/11/19 1421    naproxen (NAPROSYN) 500 MG tablet  2 times daily     05/11/19 1421           Jacqlyn Larsen, [place] 05/11/19 1455    Wyvonnia Dusky, MD 05/11/19 1753

## 2019-05-12 ENCOUNTER — Telehealth: Payer: Self-pay | Admitting: *Deleted

## 2019-05-12 NOTE — Telephone Encounter (Signed)
(  patient is aware you are out of the office) Patient was seen on 04/27/19 prescribed ortho-cyclen 0.25-35 mg tablet to help with pelvic pain. Patient has been taking pills x 2 weeks now and noticed in last 4 days, arm weakness and feeling like she has been "hit by a truck" went to ER on 05/11/19(notes in epic)  No chest pain, no breathing issues etc. . Negative covid result, she was unsure if this was related to ortho-cyclen, report she has never had these symptoms, she is going to stop the pills today and follow up next week. I told her I would route this message to you for your thoughts. Please advise

## 2019-05-15 NOTE — Telephone Encounter (Signed)
Possibly related to the pill if that is the only thing that has changed in her medical history/medications lately.  Could try something else but we will discuss when we see her in follow-up.

## 2019-05-15 NOTE — Telephone Encounter (Signed)
Patient informed, patient said she is not longer having the "hit by a truck" feeling. Reports she doesn't want to restart the ortho cyclen pills, states the "follow up" was going to be in 3 months. She could she try another pill to see if it will work or any other recommendations before the 3 month follow up?

## 2019-05-16 NOTE — Telephone Encounter (Signed)
She could try Loestrin or equivalent tablets instead of the Ortho-Cyclen.  I would recommend continuous administration, skip the placebos and go right to the next active pill pack.  She can do the placebo week every third month if she would desire.

## 2019-05-17 MED ORDER — NORETHINDRONE ACET-ETHINYL EST 1-20 MG-MCG PO TABS: ORAL_TABLET | ORAL | 4 refills | Status: DC

## 2019-05-17 NOTE — Telephone Encounter (Signed)
Patient informed, Rx sent.  

## 2019-05-17 NOTE — Telephone Encounter (Signed)
Left message for patient to call.

## 2019-05-25 ENCOUNTER — Other Ambulatory Visit: Payer: Self-pay

## 2019-05-25 ENCOUNTER — Ambulatory Visit
Admission: RE | Admit: 2019-05-25 | Discharge: 2019-05-25 | Disposition: A | Discharge status: Home / Self Care | Payer: No Typology Code available for payment source | Source: Ambulatory Visit | Attending: Obstetrics and Gynecology | Admitting: Obstetrics and Gynecology

## 2019-05-25 ENCOUNTER — Ambulatory Visit
Admission: RE | Admit: 2019-05-25 | Discharge: 2019-05-25 | Disposition: A | Discharge status: Home / Self Care | Payer: Self-pay | Source: Ambulatory Visit | Attending: Obstetrics and Gynecology | Admitting: Obstetrics and Gynecology

## 2019-05-25 ENCOUNTER — Ambulatory Visit: Payer: No Typology Code available for payment source | Admitting: *Deleted

## 2019-05-25 VITALS — BP 130/82 | Temp 97.1°F | Wt 285.0 lb

## 2019-05-25 DIAGNOSIS — N6310 Unspecified lump in the right breast, unspecified quadrant: Secondary | ICD-10-CM

## 2019-05-25 DIAGNOSIS — Z1239 Encounter for other screening for malignant neoplasm of breast: Secondary | ICD-10-CM

## 2019-05-25 DIAGNOSIS — N6320 Unspecified lump in the left breast, unspecified quadrant: Secondary | ICD-10-CM

## 2019-05-25 DIAGNOSIS — N644 Mastodynia: Secondary | ICD-10-CM

## 2019-05-25 NOTE — Progress Notes (Signed)
[name] is a 44 y.o. female who presents to [facility] clinic today with complaints of a right outer breast lump and tingling.   Pap Smear: Pap not smear completed today. Last Pap smear was 04/27/2019 at [facility] and was normal with negative HPV. Per patient has no history of an abnormal Pap smear. Last Pap smear result is in Epic.   Physical exam: Breasts Breasts symmetrical. No skin abnormalities bilateral breasts. No nipple retraction bilateral breasts. No nipple discharge bilateral breasts. No lymphadenopathy. Palpated a bb sized lump within the right breast at 9 o'clock 5 cm from the nipple. Palpated a bb sized lump within the left breast at 10 o'clock 9 cm from the nipple. Complaints of tenderness when palpated both lumps.       Pelvic/Bimanual Pap is not indicated today per [facility] guidelines.    Smoking History: Patient has a history of smoking and quit 6 months ago.  Patient Navigation: Patient education provided. Access to services provided for patient through [facility] program.   Breast and Cervical Cancer Risk Assessment: Patient has no family history of breast cancer, known genetic mutations, or radiation treatment to the chest before age 85. Patient has no history of cervical dysplasia, immunocompromised, or DES exposure in-utero.  Risk Assessment    Risk Scores      05/25/2019   Last edited by: Narda Rutherford, LPN   5-year risk: 0.7 %   Lifetime risk: 8.6 %         A: [facility] exam without pap smear.  P: Referred patient to the [facility] for a bilateral diagnostic mammogram and ultrasound. Appointment scheduled Thursday, May 25, 2019 at 1400.  Priscille Heidelberg, RN 05/25/2019 3:18 PM

## 2019-05-25 NOTE — Patient Instructions (Signed)
Explained breast self awareness with [name] [name]. Patient did not need a Pap smear today due to last Pap smear and HPV typing was 04/27/2019. Let her know [facility] will cover Pap smears and HPV typing every 5 years unless has a history of abnormal Pap smears. Referred patient to the [facility] for a bilateral diagnostic mammogram and ultrasound. Appointment scheduled Thursday, May 25, 2019 at 1400. Patient aware of appointment and will be there. [name] [name] verbalized understanding.  Srihari Shellhammer, Kathaleen Maser, RN 3:19 PM

## 2019-05-30 ENCOUNTER — Ambulatory Visit: Payer: Self-pay | Admitting: Neurology

## 2019-06-03 ENCOUNTER — Emergency Department (HOSPITAL_BASED_OUTPATIENT_CLINIC_OR_DEPARTMENT_OTHER): Payer: No Typology Code available for payment source

## 2019-06-03 ENCOUNTER — Emergency Department (HOSPITAL_BASED_OUTPATIENT_CLINIC_OR_DEPARTMENT_OTHER)
Admission: EM | Admit: 2019-06-03 | Discharge: 2019-06-03 | Disposition: A | Discharge status: Home / Self Care | Payer: No Typology Code available for payment source | Attending: Emergency Medicine | Admitting: Emergency Medicine

## 2019-06-03 ENCOUNTER — Other Ambulatory Visit: Payer: Self-pay

## 2019-06-03 ENCOUNTER — Encounter (HOSPITAL_BASED_OUTPATIENT_CLINIC_OR_DEPARTMENT_OTHER): Payer: Self-pay | Admitting: Emergency Medicine

## 2019-06-03 DIAGNOSIS — I10 Essential (primary) hypertension: Secondary | ICD-10-CM | POA: Insufficient documentation

## 2019-06-03 DIAGNOSIS — Z79899 Other long term (current) drug therapy: Secondary | ICD-10-CM | POA: Insufficient documentation

## 2019-06-03 DIAGNOSIS — M25562 Pain in left knee: Secondary | ICD-10-CM | POA: Insufficient documentation

## 2019-06-03 DIAGNOSIS — Z87891 Personal history of nicotine dependence: Secondary | ICD-10-CM | POA: Insufficient documentation

## 2019-06-03 MED ORDER — IBUPROFEN 800 MG PO TABS
800.0000 mg | ORAL_TABLET | Freq: Once | ORAL | Status: AC
  Administered 2019-06-03: 800 mg via ORAL
  Filled 2019-06-03: qty 1

## 2019-06-03 MED ORDER — IBUPROFEN 600 MG PO TABS
600.0000 mg | ORAL_TABLET | Freq: Four times a day (QID) | ORAL | 0 refills | Status: DC | PRN
Start: 2019-06-03 — End: 2020-08-02

## 2019-06-03 MED ORDER — HYDROCODONE-ACETAMINOPHEN 5-325 MG PO TABS: 1.0000 | ORAL_TABLET | ORAL | 0 refills | Status: DC | PRN

## 2019-06-03 MED ORDER — HYDROCODONE-ACETAMINOPHEN 5-325 MG PO TABS
1.0000 | ORAL_TABLET | Freq: Once | ORAL | Status: AC
  Administered 2019-06-03: 1 via ORAL
  Filled 2019-06-03: qty 1

## 2019-06-03 NOTE — ED Provider Notes (Signed)
[facility] Provider Note   CSN: [id number] Arrival date & time: 06/03/19  1924     History Chief Complaint  Patient presents with  . Knee Pain    Nichole Stone is a 44 y.o. female.  Pt presents to the ED today with left knee pain.  She has been having pain since yesterday.  She denies any trauma.  No sob or cp.        Past Medical History:  Diagnosis Date  . Fibromyalgia   . Fibromyalgia   . Hypertension    was taken off her meds she states by dr  . Obesity     There are no problems to display for this patient.   Past Surgical History:  Procedure Laterality Date  . CHOLECYSTECTOMY    . HAND SURGERY    . TONSILLECTOMY    . TUBAL LIGATION       OB History    Gravida  2   Para  2   Term      Preterm      AB      Living  2     SAB      TAB      Ectopic      Multiple      Live Births              Family History  Problem Relation Age of Onset  . Diabetes Mother   . Hypertension Mother   . Cirrhosis Mother   . Cancer Mother        Liver  . Diabetes Father   . Hypertension Father   . Parkinson's disease Father   . Dementia Father   . Kidney failure Sister   . Diabetes Sister     Social History   Tobacco Use  . Smoking status: Former Smoker    Packs/day: 0.25    Years: 10.00    Pack years: 2.50    Types: Cigarettes  . Smokeless tobacco: Never Used  Substance Use Topics  . Alcohol use: Yes    Comment: occ  . Drug use: Never    Home Medications Prior to Admission medications   Medication Sig Start Date End Date Taking? Authorizing Provider  HYDROcodone-acetaminophen (NORCO/VICODIN) 5-325 MG tablet Take 1 tablet by mouth every 4 (four) hours as needed. 06/03/19   Isla Pence, MD  ibuprofen (ADVIL) 600 MG tablet Take 1 tablet (600 mg total) by mouth every 6 (six) hours as needed. 06/03/19   Isla Pence, MD  naproxen (NAPROSYN) 500 MG tablet Take 1 tablet (500 mg total) by  mouth 2 (two) times daily. 05/11/19   Jacqlyn Larsen, PA-C  norethindrone-ethinyl estradiol (LOESTRIN) 1-20 MG-MCG tablet Take one tablet by mouth daily skipping placebo week in 1st and 2nd pack.(for continuous use)  In 3rd pack take 1 tablet daily including placebo pills. 05/17/19   Joseph Pierini, MD  ondansetron (ZOFRAN ODT) 4 MG disintegrating tablet 4mg  ODT q4 hours prn nausea/vomit 05/11/19   Jacqlyn Larsen, PA-C  TURMERIC PO Take by mouth.    [provider]    Allergies    Lisinopril  Review of Systems   Review of Systems  Musculoskeletal:       Left knee pain  All other systems reviewed and are negative.   Physical Exam Updated Vital Signs BP 136/78 (BP Location: Right Arm)   Pulse 77   Temp 98.2 F (36.8 C) (Oral)   Resp  18   LMP 06/01/2019   SpO2 100%   Physical Exam Vitals and nursing note reviewed.  Constitutional:      Appearance: Normal appearance.  HENT:     Head: Normocephalic and atraumatic.     Right Ear: External ear normal.     Left Ear: External ear normal.     Nose: Nose normal.     Mouth/Throat:     Mouth: Mucous membranes are moist.     Pharynx: Oropharynx is clear.  Eyes:     Extraocular Movements: Extraocular movements intact.     Conjunctiva/sclera: Conjunctivae normal.     Pupils: Pupils are equal, round, and reactive to light.  Cardiovascular:     Rate and Rhythm: Normal rate and regular rhythm.     Pulses: Normal pulses.     Heart sounds: Normal heart sounds.  Pulmonary:     Effort: Pulmonary effort is normal.     Breath sounds: Normal breath sounds.  Abdominal:     General: Abdomen is flat. Bowel sounds are normal.     Palpations: Abdomen is soft.  Musculoskeletal:     Cervical back: Normal range of motion and neck supple.       Legs:  Skin:    General: Skin is warm.     Capillary Refill: Capillary refill takes less than 2 seconds.  Neurological:     General: No focal deficit present.     Mental Status: She is alert  and oriented to person, place, and time.  Psychiatric:        Mood and Affect: Mood normal.        Behavior: Behavior normal.     ED Results / Procedures / Treatments   Labs (all labs ordered are listed, but only abnormal results are displayed) Labs Reviewed - No data to display  EKG None  Radiology US Venous Img Lower Unilateral Left  Result Date: 06/03/2019 CLINICAL DATA:  Pain EXAM: LEFT LOWER EXTREMITY VENOUS DOPPLER ULTRASOUND TECHNIQUE: Gray-scale sonography with compression, as well as color and duplex ultrasound, were performed to evaluate the deep venous system(s) from the level of the common femoral vein through the popliteal and proximal calf veins. COMPARISON:  None. FINDINGS: VENOUS Normal compressibility of the common femoral, superficial femoral, and popliteal veins, as well as the visualized calf veins. Visualized portions of profunda femoral vein and great saphenous vein unremarkable. No filling defects to suggest DVT on grayscale or color Doppler imaging. Doppler waveforms show normal direction of venous flow, normal respiratory plasticity and response to augmentation. The peroneal vein was suboptimally evaluated secondary to patient body habitus. Limited views of the contralateral common femoral vein are unremarkable. OTHER None. Limitations: Patient body habitus IMPRESSION: Negative. Electronically Signed   By: Katherine Mantle M.D.   On: 06/03/2019 20:31   DG Knee Complete 4 Views Left  Result Date: 06/03/2019 CLINICAL DATA:  Left anterior and lateral knee pain for 2 days EXAM: LEFT KNEE - COMPLETE 4+ VIEW COMPARISON:  None. FINDINGS: Frontal, bilateral oblique, lateral views of the left knee demonstrates no fracture, subluxation, or dislocation. Mild joint space narrowing in the medial compartment. Small patellar spurs are noted. No joint effusion. IMPRESSION: 1. Mild osteoarthritis.  No acute fracture. Electronically Signed   By: Sharlet Salina M.D.   On: 06/03/2019  20:36    Procedures Procedures (including critical care time)  Medications Ordered in ED Medications  HYDROcodone-acetaminophen (NORCO/VICODIN) 5-325 MG per tablet 1 tablet (1 tablet Oral Given 06/03/19 2035)  ibuprofen (ADVIL) tablet 800 mg (800 mg Oral Given 06/03/19 2035)    ED Course  I have reviewed the triage vital signs and the nursing notes.  Pertinent labs & imaging results that were available during my care of the patient were reviewed by me and considered in my medical decision making (see chart for details).    MDM Rules/Calculators/A&P                      Xray neg for fx.  [place] neg for DVT.  Pt placed in a hinge knee brace and instructed to f/u with ortho.  Return if worse. Final Clinical Impression(s) / ED Diagnoses Final diagnoses:  Acute pain of left knee    Rx / DC Orders ED Discharge Orders         Ordered    HYDROcodone-acetaminophen (NORCO/VICODIN) 5-325 MG tablet  Every 4 hours PRN     06/03/19 2043    ibuprofen (ADVIL) 600 MG tablet  Every 6 hours PRN     06/03/19 2043           Jacalyn Lefevre, MD 06/03/19 2044

## 2019-06-03 NOTE — ED Triage Notes (Signed)
Pt reports left sided pain since yesterday, denies trauma. Concerned that varicose veins could be cause. Ambulatory.

## 2019-06-14 ENCOUNTER — Other Ambulatory Visit: Payer: Self-pay

## 2019-06-14 ENCOUNTER — Encounter: Payer: Self-pay | Admitting: Surgery

## 2019-06-14 ENCOUNTER — Ambulatory Visit (INDEPENDENT_AMBULATORY_CARE_PROVIDER_SITE_OTHER): Payer: Self-pay | Admitting: Surgery

## 2019-06-14 VITALS — BP 154/98 | HR 69 | Ht 66.0 in | Wt 285.0 lb

## 2019-06-14 DIAGNOSIS — M2342 Loose body in knee, left knee: Secondary | ICD-10-CM

## 2019-06-14 DIAGNOSIS — M25562 Pain in left knee: Secondary | ICD-10-CM

## 2019-06-14 MED ORDER — LIDOCAINE HCL 1 % IJ SOLN
3.0000 mL | INTRAMUSCULAR | Status: AC | PRN
  Administered 2019-06-14: 3 mL

## 2019-06-14 MED ORDER — METHYLPREDNISOLONE ACETATE 40 MG/ML IJ SUSP
80.0000 mg | INTRAMUSCULAR | Status: AC | PRN
  Administered 2019-06-14: 80 mg via INTRA_ARTICULAR

## 2019-06-14 MED ORDER — BUPIVACAINE HCL 0.25 % IJ SOLN
6.0000 mL | INTRAMUSCULAR | Status: AC | PRN
  Administered 2019-06-14: 6 mL via INTRA_ARTICULAR

## 2019-06-14 NOTE — Progress Notes (Signed)
Office Visit Note   Patient: Nichole Stone           Date of Birth: [birth year]           MRN: [id number] Visit Date: 06/14/2019              Requested by: No referring provider defined for this encounter. PCP: Patient, No Pcp Per   Assessment & Plan: Visit Diagnoses:  1. Acute pain of left knee   2. Loose body in knee, left knee   3. Mechanical knee pain, left     Plan: In hopes of giving patient relief of her knee pain today offered conservative treatment with injection.  At patient sent left knee prepped with Betadine and intra-articular Marcaine/Depo-Medrol injection was performed.  Tolerated well without complication.  After sitting for a few minutes she reported excellent relief of pain with Marcaine in place.  Recommend that she be out of work at least 2 days and then can return to regular duty at that time.  Follow-up with me in 2 weeks for recheck.  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  All questions answered.  Follow-Up Instructions: Return in about 2 weeks (around 06/28/2019) for with [facility] recheck knee.   Orders:  Orders Placed This Encounter  Procedures  . Large Joint Inj   No orders of the defined types were placed in this encounter.     Procedures: Large Joint Inj on 06/14/2019 10:07 AM Indications: pain Details: 25 G 1.5 in needle, anteromedial approach Medications: 3 mL lidocaine 1 %; 6 mL bupivacaine 0.25 %; 80 mg methylPREDNISolone acetate 40 MG/ML Outcome: tolerated well, no immediate complications Consent was given by the patient. Patient was prepped and draped in the usual sterile fashion.       Clinical Data: No additional findings.   Subjective: Chief Complaint  Patient presents with  . Left Knee - Pain    HPI 44 year old female who is new patient to clinic comes in with complaints of left knee pain  mechanical symptoms.  Patient states that she has had off-and-on aching and popping in her knee for at least a couple years.  Jun 01, 2019 she started having some pain in her knee throughout the day and this progressively got worse.  Denies any specific injury.  No pain of this nature in the past with the left knee.  She did go to the emergency room Jun 03, 2019 and x-rays were done. The report read mild osteoarthritis.  No acute fracture. Describes severe sharp pain with weightbearing and flexion and extension of her knee.  Some feeling of buckling and catching with pivoting movements.  No lumbar hip or radicular component.  She has been wearing a knee sleeve without any improvement.  Also taken oral NSAIDs. Review of Systems No current cardiac pulmonary GI GU issues  Objective: Vital Signs: BP (!) 154/98   Pulse 69   Ht 5\' 6"  (1.676 m)   Wt 285 lb (129.3 kg)   LMP 06/01/2019   BMI 46.00 kg/m   Physical Exam HENT:     Head: Normocephalic.  Pulmonary:     Effort: Pulmonary effort is normal. No respiratory distress.  Musculoskeletal:     Comments: Gait antalgic.  Negative log bilateral hips.. Limitation in range of motion but with discomfort at about 5 degrees extension and  100 degrees of flexion.  Positive patellofemoral crepitus.  Moderately tender medial plica.  Medial greater than lateral joint line tenderness.  Pain with McMurray's testing.  Ligaments are stable.  Calf nontender.  Neurovascular intact.  Skin:    General: Skin is warm and dry.  Neurological:     General: No focal deficit present.     Mental Status: She is alert and oriented to person, place, and time.     Ortho Exam  Specialty Comments:  No specialty comments available.  Imaging: No results found.   PMFS History: There are no problems to display for this patient.  Past Medical History:  Diagnosis Date  . Fibromyalgia   . Fibromyalgia   . Hypertension    was taken off her meds she states by dr  .  Obesity     Family History  Problem Relation Age of Onset  . Diabetes Mother   . Hypertension Mother   . Cirrhosis Mother   . Cancer Mother        Liver  . Diabetes Father   . Hypertension Father   . Parkinson's disease Father   . Dementia Father   . Kidney failure Sister   . Diabetes Sister     Past Surgical History:  Procedure Laterality Date  . CHOLECYSTECTOMY    . HAND SURGERY    . TONSILLECTOMY    . TUBAL LIGATION     Social History   Occupational History  . Not on file  Tobacco Use  . Smoking status: Former Smoker    Packs/day: 0.25    Years: 10.00    Pack years: 2.50    Types: Cigarettes  . Smokeless tobacco: Never Used  Substance and Sexual Activity  . Alcohol use: Yes    Comment: occ  . Drug use: Never  . Sexual activity: Yes    Birth control/protection: Surgical    Comment: 1st intercourse 44 yo-More than 5 partners    X-rays I did review films from the emergency room.  She does have mild degenerative changes seen more in the patellofemoral compartment.  Looks like a possible anterior loose body seen on lateral view.

## 2019-06-22 ENCOUNTER — Ambulatory Visit: Payer: Self-pay | Admitting: Surgery

## 2019-06-28 ENCOUNTER — Encounter: Payer: Self-pay | Admitting: Surgery

## 2019-06-28 ENCOUNTER — Ambulatory Visit (INDEPENDENT_AMBULATORY_CARE_PROVIDER_SITE_OTHER): Payer: Self-pay | Admitting: Surgery

## 2019-06-28 ENCOUNTER — Other Ambulatory Visit: Payer: Self-pay

## 2019-06-28 VITALS — Ht 66.0 in | Wt 285.0 lb

## 2019-06-28 DIAGNOSIS — M25562 Pain in left knee: Secondary | ICD-10-CM

## 2019-06-28 DIAGNOSIS — M2342 Loose body in knee, left knee: Secondary | ICD-10-CM

## 2019-06-28 NOTE — Progress Notes (Signed)
   Office Visit Note   Patient: Nichole Stone           Date of Birth: 1975/02/06           MRN: [id number] Visit Date: 06/28/2019              Requested by: No referring provider defined for this encounter. PCP: Patient, No Pcp Per   Assessment & Plan: Visit Diagnoses:  1. Left knee pain, unspecified chronicity   2. Loose body in knee, left knee   3. 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  Follow-up with Dr. August Saucer after completion to discuss results and treatment options.  Follow-Up Instructions: Return in about 2 weeks (around 07/12/2019) for MRI review with Dr. August Saucer.   Orders:  Orders Placed This Encounter  Procedures  . MR Knee Left w/o contrast   No orders of the defined types were placed in this encounter.     Procedures: No procedures performed   Clinical Data: No additional findings.   Subjective: Chief Complaint  Patient presents with  . Left Knee - Follow-up    HPI Patient returns for evaluation of her left knee pain.  She did not have long-term improvement with previous intra-articular Marcaine/Depo-Medrol injection.  Continues have ongoing knee pain and feeling mechanical symptoms.   Objective: Vital Signs: Ht 5\' 6"  (1.676 m)   Wt 285 lb (129.3 kg)   LMP 06/01/2019   BMI 46.00 kg/m   Physical Exam Gait is antalgic.  Joint line tender.  Pain McMurray's testing. Ortho Exam  Specialty Comments:  No specialty comments available.  Imaging: No results found.   PMFS History: There are no problems to display for this patient.  Past Medical History:  Diagnosis Date  . Fibromyalgia   . Fibromyalgia   . Hypertension    was taken off her meds she states by dr  . Obesity     Family History  Problem Relation Age of Onset  . Diabetes Mother   . Hypertension Mother   .  Cirrhosis Mother   . Cancer Mother        Liver  . Diabetes Father   . Hypertension Father   . Parkinson's disease Father   . Dementia Father   . Kidney failure Sister   . Diabetes Sister     Past Surgical History:  Procedure Laterality Date  . CHOLECYSTECTOMY    . HAND SURGERY    . TONSILLECTOMY    . TUBAL LIGATION     Social History   Occupational History  . Not on file  Tobacco Use  . Smoking status: Former Smoker    Packs/day: 0.25    Years: 10.00    Pack years: 2.50    Types: Cigarettes  . Smokeless tobacco: Never Used  Vaping Use  . Vaping Use: Never used  Substance and Sexual Activity  . Alcohol use: Yes    Comment: occ  . Drug use: Never  . Sexual activity: Yes    Birth control/protection: Surgical    Comment: 1st intercourse 44 yo-More than 5 partners

## 2019-06-29 ENCOUNTER — Telehealth: Payer: Self-pay | Admitting: Surgery

## 2019-06-29 NOTE — Telephone Encounter (Signed)
Please advise 

## 2019-06-29 NOTE — Telephone Encounter (Signed)
Pt called wanting to know if she can be prescribed something for the pain. Pt see's Barry Dienes.  [phone]

## 2019-07-03 ENCOUNTER — Telehealth: Payer: Self-pay | Admitting: Surgery

## 2019-07-03 NOTE — Telephone Encounter (Signed)
Recommend using Tylenol and/or oral NSAID

## 2019-07-03 NOTE — Telephone Encounter (Signed)
Please advise 

## 2019-07-03 NOTE — Telephone Encounter (Signed)
Patient called asked what can she take other than Tylenol and it's not helping with the pain. The number to contact patient is [phone]

## 2019-07-03 NOTE — Telephone Encounter (Signed)
Looks like patient has taken naproxen and ibuprofen in the past.  Tell her she can use 1 of these medications until she follows up to review her knee MRI scan

## 2019-07-04 ENCOUNTER — Telehealth: Payer: Self-pay | Admitting: Surgery

## 2019-07-04 MED ORDER — TRAMADOL HCL 50 MG PO TABS: 50.0000 mg | ORAL_TABLET | Freq: Two times a day (BID) | ORAL | 0 refills | Status: DC | PRN

## 2019-07-04 NOTE — Telephone Encounter (Signed)
Called to pharmacy. Patient aware. 

## 2019-07-04 NOTE — Telephone Encounter (Signed)
Duplicate message in chart.  

## 2019-07-04 NOTE — Telephone Encounter (Signed)
I left voicemail advising. ?

## 2019-07-04 NOTE — Telephone Encounter (Signed)
Patient called requesting a call back. Patient states she received a call she believes from [place] or [facility] nurse about her MRI and pain medication. Please give patient a call back at [phone].

## 2019-07-04 NOTE — Telephone Encounter (Signed)
OK ultram # 20  one po bid prn pain thank you.

## 2019-07-04 NOTE — Telephone Encounter (Signed)
Can you please advise since Nichole Stone is out of the office?  Patient is requesting something for pain. Nichole Stone advised OTC meds until MRI review in office. I called patient and advised. She has been trying OTC meds with no relief at all. Her MRI is not scheduled until July 21. She is on the cancellation list, but that is the soonest that they are able to get her in at this time. She does not feel she can deal with this pain for that long.  She had knee injection in the office with no relief at all and feels that her knee is getting worse.

## 2019-07-04 NOTE — Telephone Encounter (Signed)
I called, no answer.  ?

## 2019-07-18 ENCOUNTER — Telehealth: Payer: Self-pay | Admitting: Specialist

## 2019-07-18 NOTE — Telephone Encounter (Signed)
Pt called stating she would like something sent in for her pain and would like a CB to discuss pain management until her MRI on 08/02/19.  [phone]

## 2019-07-19 NOTE — Telephone Encounter (Signed)
Nichole Stone, Can you please advise on this? She is sched for MRI 08/02/19, and she is scheduled with Dr. Otelia Sergeant on 08/07/19 to review. He has not seen her yet.

## 2019-07-20 ENCOUNTER — Telehealth: Payer: Self-pay

## 2019-07-20 NOTE — Telephone Encounter (Signed)
Talked with patient and advised her of message below per [name].  Patient stated that she does not need a work note to be out.  Voiced that she understands.

## 2019-07-20 NOTE — Telephone Encounter (Signed)
Patient called again concerning a Rx for pain.  Stated that she has tried Tylenol, Ibuprofen, Advil, etc. And nothing is helping.  CB# [phone].  Please advise.  Thank you.

## 2019-07-20 NOTE — Telephone Encounter (Signed)
Advised patient continue the Advil and Tylenol along with ice off and on as needed.  If she needs a note keeping her out of work you can give her one.  Do not recommend starting any narcotics.  I am trying to get MRI scheduled sooner than July 21.

## 2019-07-20 NOTE — Telephone Encounter (Signed)
Nichole Stone is taking care of the medication question.  She is also going to reschedule her follow-up appointment for MRI review with Dr. August Saucer per my last note.

## 2019-07-21 ENCOUNTER — Other Ambulatory Visit: Payer: Self-pay

## 2019-07-21 ENCOUNTER — Ambulatory Visit
Admission: RE | Admit: 2019-07-21 | Discharge: 2019-07-21 | Disposition: A | Discharge status: Home / Self Care | Payer: No Typology Code available for payment source | Source: Ambulatory Visit | Attending: Surgery | Admitting: Surgery

## 2019-07-21 DIAGNOSIS — M25562 Pain in left knee: Secondary | ICD-10-CM

## 2019-07-26 ENCOUNTER — Ambulatory Visit (INDEPENDENT_AMBULATORY_CARE_PROVIDER_SITE_OTHER): Payer: Self-pay | Admitting: Orthopedic Surgery

## 2019-07-26 VITALS — Ht 66.0 in | Wt 285.0 lb

## 2019-07-26 DIAGNOSIS — M25562 Pain in left knee: Secondary | ICD-10-CM

## 2019-07-29 ENCOUNTER — Encounter: Payer: Self-pay | Admitting: Orthopedic Surgery

## 2019-07-29 NOTE — Progress Notes (Signed)
Office Visit Note   Patient: Nichole Stone           Date of Birth: 16-Jan-1975           MRN: [id number] Visit Date: 07/26/2019 Requested by: No referring provider defined for this encounter. PCP: Patient, No Pcp Per  Subjective: Chief Complaint  Patient presents with  . Left Knee - Pain    HPI: Nichole Stone is a patient here for evaluation of left knee pain.  She is had pain on and off for a while but is been worse since May 2021.  She reports some popping.  The pain wakes her from sleep at night.  No relief from injection given through an anterior approach.  Takes over-the-counter medication without relief.  Tried a brace without help.  She was on tramadol.  Describes lateral greater than medial sided pain.  She works as a Technical brewer.  She does do home exercise program.  Injured her knee when she bent the knee to pick up a television.  MRI scan is reviewed in depth.  Mild degenerative changes are present.  There is mention of some small radial tear posterior horn of the lateral meniscus but that is really difficult to see.  No loose bodies.  Ligaments intact.              ROS: All systems reviewed are negative as they relate to the chief complaint within the history of present illness.  Patient denies  fevers or chills.   Assessment & Plan: Visit Diagnoses:  1. Acute pain of left knee     Plan: Impression is acute pain left knee with no real clear-cut actionable pathology that we can treat arthroscopically.  I told Khalani there was a about an equal chance that her knee would not benefit from arthroscopy compared to it would benefit.  In general it is a gamble for her to undergo arthroscopic exploration of the knee.  On the MRI scan which typically has relatively high sensitivity and specificity for operative pathology none can be found.  She is somewhat unsatisfied with her options.  Realistically she has to decide if this pain is bad enough for her to undergo a procedure which  may or may not help her.  I will see her back as needed.  No further injections per the patient.  We talked about gel injections she does not want to try that and she really does not want to try any other conservative interventions.  No pain medicine prescribed.  Follow-up as needed  Follow-Up Instructions: No follow-ups on file.   Orders:  No orders of the defined types were placed in this encounter.  No orders of the defined types were placed in this encounter.     Procedures: No procedures performed   Clinical Data: No additional findings.  Objective: Vital Signs: Ht 5\' 6"  (1.676 m)   Wt 285 lb (129.3 kg)   BMI 46.00 kg/m   Physical Exam:   Constitutional: Patient appears well-developed HEENT:  Head: Normocephalic Eyes:EOM are normal Neck: Normal range of motion Cardiovascular: Normal rate Pulmonary/chest: Effort normal Neurologic: Patient is alert Skin: Skin is warm Psychiatric: Patient has normal mood and affect    Ortho Exam: Ortho exam demonstrates normal gait.  Range of motion left knee is full.  No effusion.  No patellar apprehension.  Extensor mechanism is intact.  Mild lateral joint line tenderness and mild medial joint line tenderness.  Pedal pulses palpable.  No groin  pain with internal X rotation of the leg.  No nerve root tension signs.  Specialty Comments:  No specialty comments available.  Imaging: No results found.   PMFS History: There are no problems to display for this patient.  Past Medical History:  Diagnosis Date  . Fibromyalgia   . Fibromyalgia   . Hypertension    was taken off her meds she states by dr  . Obesity     Family History  Problem Relation Age of Onset  . Diabetes Mother   . Hypertension Mother   . Cirrhosis Mother   . Cancer Mother        Liver  . Diabetes Father   . Hypertension Father   . Parkinson's disease Father   . Dementia Father   . Kidney failure Sister   . Diabetes Sister     Past Surgical History:   Procedure Laterality Date  . CHOLECYSTECTOMY    . HAND SURGERY    . TONSILLECTOMY    . TUBAL LIGATION     Social History   Occupational History  . Not on file  Tobacco Use  . Smoking status: Former Smoker    Packs/day: 0.25    Years: 10.00    Pack years: 2.50    Types: Cigarettes  . Smokeless tobacco: Never Used  Vaping Use  . Vaping Use: Never used  Substance and Sexual Activity  . Alcohol use: Yes    Comment: occ  . Drug use: Never  . Sexual activity: Yes    Birth control/protection: Surgical    Comment: 1st intercourse 44 yo-More than 5 partners

## 2019-08-02 ENCOUNTER — Other Ambulatory Visit: Payer: Self-pay

## 2019-08-07 ENCOUNTER — Ambulatory Visit: Payer: Self-pay | Admitting: Specialist

## 2019-08-14 NOTE — Progress Notes (Deleted)
NEUROLOGY CONSULTATION NOTE  Nichole Stone MRN: [id number] DOB: January 10, 1976  Referring provider: Linwood Dibbles, MD (ED referral) Primary care provider: ***  Reason for consult:  headache  HISTORY OF PRESENT ILLNESS: Nichole Stone is a 44 year old ***-handed female with fibromyalgia who presents for headache.  History supplemented by ED note.  She has history of migraines since ***.  They are severe right-sided throbbing pain with nausea, vomiting, photophobia ***.  They typically last *** and occur ***.  Triggers include ***.  Rest and pulling her hair on the right side help relieve it.  Due to worsening headache, she has been to ***  Current NSAIDS:  Ibuprofen, naproxen Current analgesics:  *** Current triptans:  *** Current ergotamine:  *** Current anti-emetic:  Zofran ODT 4mg  Current muscle relaxants:  none Current anti-anxiolytic:  none Current sleep aide:  none Current Antihypertensive medications:  *** Current Antidepressant medications:  *** Current Anticonvulsant medications:  *** Current anti-CGRP:  *** Current Vitamins/Herbal/Supplements:  turmeric Current Antihistamines/Decongestants:  none Other therapy:  *** Hormone/birth control:  Loestrin  Past NSAIDS:  *** Past analgesics:  *** Past abortive triptans:  *** Past abortive ergotamine:  *** Past muscle relaxants:  *** Past anti-emetic:  *** Past antihypertensive medications:  *** Past antidepressant medications:  *** Past anticonvulsant medications:  *** Past anti-CGRP:  *** Past vitamins/Herbal/Supplements:  *** Past antihistamines/decongestants:  *** Other past therapies:  ***  Caffeine:  *** Alcohol:  *** Smoker:  *** Diet:  *** Exercise:  *** Depression:  ***; Anxiety:  *** Other pain:  *** Sleep hygiene:  *** Family history of headache:  ***  CBC and CMP from April were unremarkable except for elevated glucose 126, mildly low Ca 8.2 and mildly low AST 12.     PAST  MEDICAL HISTORY: Past Medical History:  Diagnosis Date  . Fibromyalgia   . Fibromyalgia   . Hypertension    was taken off her meds she states by dr  . Obesity     PAST SURGICAL HISTORY: Past Surgical History:  Procedure Laterality Date  . CHOLECYSTECTOMY    . HAND SURGERY    . TONSILLECTOMY    . TUBAL LIGATION      MEDICATIONS: Current Outpatient Medications on File Prior to Visit  Medication Sig Dispense Refill  . HYDROcodone-acetaminophen (NORCO/VICODIN) 5-325 MG tablet Take 1 tablet by mouth every 4 (four) hours as needed. 10 tablet 0  . ibuprofen (ADVIL) 600 MG tablet Take 1 tablet (600 mg total) by mouth every 6 (six) hours as needed. 30 tablet 0  . naproxen (NAPROSYN) 500 MG tablet Take 1 tablet (500 mg total) by mouth 2 (two) times daily. 30 tablet 0  . norethindrone-ethinyl estradiol (LOESTRIN) 1-20 MG-MCG tablet Take one tablet by mouth daily skipping placebo week in 1st and 2nd pack.(for continuous use)  In 3rd pack take 1 tablet daily including placebo pills. 3 Package 4  . ondansetron (ZOFRAN ODT) 4 MG disintegrating tablet 4mg  ODT q4 hours prn nausea/vomit 10 tablet 0  . traMADol (ULTRAM) 50 MG tablet Take 1 tablet (50 mg total) by mouth 2 (two) times daily as needed. 20 tablet 0  . TURMERIC PO Take by mouth.     No current facility-administered medications on file prior to visit.    ALLERGIES: Allergies  Allergen Reactions  . Lisinopril Cough    FAMILY HISTORY: Family History  Problem Relation Age of Onset  . Diabetes Mother   . Hypertension Mother   .  Cirrhosis Mother   . Cancer Mother        Liver  . Diabetes Father   . Hypertension Father   . Parkinson's disease Father   . Dementia Father   . Kidney failure Sister   . Diabetes Sister     SOCIAL HISTORY: Social History   Socioeconomic History  . Marital status: Single    Spouse name: Not on file  . Number of children: 2  . Years of education: Not on file  . Highest education level: Some  college, no degree  Occupational History  . Not on file  Tobacco Use  . Smoking status: Former Smoker    Packs/day: 0.25    Years: 10.00    Pack years: 2.50    Types: Cigarettes  . Smokeless tobacco: Never Used  Vaping Use  . Vaping Use: Never used  Substance and Sexual Activity  . Alcohol use: Yes    Comment: occ  . Drug use: Never  . Sexual activity: Yes    Birth control/protection: Surgical    Comment: 1st intercourse 44 yo-More than 5 partners  Other Topics Concern  . Not on file  Social History Narrative  . Not on file   Social Determinants of Health   Financial Resource Strain:   . Difficulty of Paying Living Expenses:   Food Insecurity:   . Worried About Programme researcher, broadcasting/film/video in the Last Year:   . Barista in the Last Year:   Transportation Needs: No Transportation Needs  . Lack of Transportation (Medical): No  . Lack of Transportation (Non-Medical): No  Physical Activity:   . Days of Exercise per Week:   . Minutes of Exercise per Session:   Stress:   . Feeling of Stress :   Social Connections:   . Frequency of Communication with Friends and Family:   . Frequency of Social Gatherings with Friends and Family:   . Attends Religious Services:   . Active Member of Clubs or Organizations:   . Attends Banker Meetings:   Marland Kitchen Marital Status:   Intimate Partner Violence:   . Fear of Current or Ex-Partner:   . Emotionally Abused:   Marland Kitchen Physically Abused:   . Sexually Abused:     PHYSICAL EXAM: *** General: No acute distress.  Patient appears well-groomed.  Head:  Normocephalic/atraumatic Eyes:  fundi examined but not visualized Neck: supple, no paraspinal tenderness, full range of motion Back: No paraspinal tenderness Heart: regular rate and rhythm Lungs: Clear to auscultation bilaterally. Vascular: No carotid bruits. Neurological Exam: Mental status: alert and oriented to person, place, and time, recent and remote memory intact, fund of  knowledge intact, attention and concentration intact, speech fluent and not dysarthric, language intact. Cranial nerves: CN I: not tested CN II: pupils equal, round and reactive to light, visual fields intact CN III, IV, VI:  full range of motion, no nystagmus, no ptosis CN V: facial sensation intact CN VII: upper and lower face symmetric CN VIII: hearing intact CN IX, X: gag intact, uvula midline CN XI: sternocleidomastoid and trapezius muscles intact CN XII: tongue midline Bulk & Tone: normal, no fasciculations. Motor:  5/5 throughout  Sensation:  Pinprick and vibration sensation intact. Deep Tendon Reflexes:  2+ throughout, toes downgoing.  Finger to nose testing:  Without dysmetria.  Heel to shin:  Without dysmetria.  Gait:  Normal station and stride.  Able to turn and tandem walk. Romberg negative.  IMPRESSION: ***  PLAN: 1.  For preventative management, *** 2.  For abortive therapy, *** 3.  Limit use of pain relievers to no more than 2 days out of week to prevent risk of rebound or medication-overuse headache. 4.  Keep headache diary 5.  Exercise, hydration, caffeine cessation, sleep hygiene, monitor for and avoid triggers 6.  Follow up ***   Thank you for allowing me to take part in the care of this patient.  Shon Millet, DO  CC: ***

## 2019-08-15 ENCOUNTER — Ambulatory Visit: Payer: Self-pay | Admitting: Neurology

## 2019-08-22 ENCOUNTER — Encounter (HOSPITAL_BASED_OUTPATIENT_CLINIC_OR_DEPARTMENT_OTHER): Payer: Self-pay | Admitting: *Deleted

## 2019-08-22 ENCOUNTER — Emergency Department (HOSPITAL_BASED_OUTPATIENT_CLINIC_OR_DEPARTMENT_OTHER): Payer: Managed Care, Other (non HMO)

## 2019-08-22 ENCOUNTER — Other Ambulatory Visit: Payer: Self-pay

## 2019-08-22 ENCOUNTER — Emergency Department (HOSPITAL_BASED_OUTPATIENT_CLINIC_OR_DEPARTMENT_OTHER)
Admission: EM | Admit: 2019-08-22 | Discharge: 2019-08-22 | Disposition: A | Discharge status: Home / Self Care | Payer: Managed Care, Other (non HMO) | Attending: Emergency Medicine | Admitting: Emergency Medicine

## 2019-08-22 DIAGNOSIS — X58XXXA Exposure to other specified factors, initial encounter: Secondary | ICD-10-CM | POA: Insufficient documentation

## 2019-08-22 DIAGNOSIS — M797 Fibromyalgia: Secondary | ICD-10-CM | POA: Insufficient documentation

## 2019-08-22 DIAGNOSIS — Y929 Unspecified place or not applicable: Secondary | ICD-10-CM | POA: Insufficient documentation

## 2019-08-22 DIAGNOSIS — Y939 Activity, unspecified: Secondary | ICD-10-CM | POA: Insufficient documentation

## 2019-08-22 DIAGNOSIS — Y999 Unspecified external cause status: Secondary | ICD-10-CM | POA: Diagnosis not present

## 2019-08-22 DIAGNOSIS — I10 Essential (primary) hypertension: Secondary | ICD-10-CM | POA: Diagnosis not present

## 2019-08-22 DIAGNOSIS — Z87891 Personal history of nicotine dependence: Secondary | ICD-10-CM | POA: Diagnosis not present

## 2019-08-22 DIAGNOSIS — M778 Other enthesopathies, not elsewhere classified: Secondary | ICD-10-CM

## 2019-08-22 DIAGNOSIS — S46311A Strain of muscle, fascia and tendon of triceps, right arm, initial encounter: Secondary | ICD-10-CM | POA: Insufficient documentation

## 2019-08-22 DIAGNOSIS — M25521 Pain in right elbow: Secondary | ICD-10-CM | POA: Diagnosis present

## 2019-08-22 NOTE — Discharge Instructions (Signed)
Please continue with Tylenol as needed for symptoms discomfort.  The best that can do is rest the arm as this is likely an overuse injury, triceps tenderness.  Please read the attachment.  I would like for you to refrain from work for the next 1 to 2 days or at the very least reduce your physical activity (lifting and moving items).    I have placed a referral to Dr. Duwayne Heck at [facility] given that you would prefer not to go back to your previous orthopedist.   Please read the attachment on RICE therapy for routine injuries.  Return to the ED or seek immediate medical attention for any new or worsening symptoms.

## 2019-08-22 NOTE — ED Provider Notes (Signed)
[facility] EMERGENCY DEPARTMENT Provider Note   CSN: [id number] Arrival date & time: 08/22/19  1704     History Chief Complaint  Patient presents with  . Elbow Pain    Nichole Stone is a 44 y.o. female with PMH of fibromyalgia who presents to the ED with 1 week history of reproducible right elbow pain.  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  She states that her coworker is pregnant and so she has had increased physical activity and exertion while at work.  She states that for the past week she has had right elbow pain that radiates into her right tricep region that has been reproducible with activity and movement.  She denies any swelling, numbness or weakness, redness or other overlying skin changes, fevers or chills, chest pain or shortness of breath, injury, IVDA or recent GC infection, or other changes.  She has taken Tylenol for her symptoms.  She also endorses a history of PUD and cannot take NSAIDs.  She was followed by an orthopedist, Dr. August Saucer, but would prefer referral elsewhere.  HPI     Past Medical History:  Diagnosis Date  . Fibromyalgia   . Fibromyalgia   . Hypertension    was taken off her meds she states by dr  . Obesity     There are no problems to display for this patient.   Past Surgical History:  Procedure Laterality Date  . CHOLECYSTECTOMY    . HAND SURGERY    . TONSILLECTOMY    . TUBAL LIGATION       OB History    Gravida  2   Para  2   Term      Preterm      AB      Living  2     SAB      TAB      Ectopic      Multiple      Live Births              Family History  Problem Relation Age of Onset  . Diabetes Mother   . Hypertension Mother   . Cirrhosis Mother   . Cancer Mother        Liver  . Diabetes Father   . Hypertension Father   . Parkinson's disease Father   . Dementia Father   . Kidney failure Sister   . Diabetes Sister      Social History   Tobacco Use  . Smoking status: Former Smoker    Packs/day: 0.25    Years: 10.00    Pack years: 2.50    Types: Cigarettes  . Smokeless tobacco: Never Used  Vaping Use  . Vaping Use: Never used  Substance Use Topics  . Alcohol use: Yes    Comment: occ  . Drug use: Never    Home Medications Prior to Admission medications   Medication Sig Start Date End Date Taking? Authorizing Provider  HYDROcodone-acetaminophen (NORCO/VICODIN) 5-325 MG tablet Take 1 tablet by mouth every 4 (four) hours as needed. 06/03/19   Jacalyn Lefevre, MD  ibuprofen (ADVIL) 600 MG tablet Take 1 tablet (600 mg total) by mouth every 6 (six) hours as needed. 06/03/19   Jacalyn Lefevre, MD  naproxen (NAPROSYN) 500 MG tablet Take 1 tablet (500 mg total) by mouth 2 (two) times daily. 05/11/19   Dartha Lodge, PA-C  norethindrone-ethinyl estradiol (LOESTRIN) 1-20 MG-MCG  tablet Take one tablet by mouth daily skipping placebo week in 1st and 2nd pack.(for continuous use)  In 3rd pack take 1 tablet daily including placebo pills. 05/17/19   Theresia Majors, MD  ondansetron (ZOFRAN ODT) 4 MG disintegrating tablet 4mg  ODT q4 hours prn nausea/vomit 05/11/19   05/13/19, PA-C  traMADol (ULTRAM) 50 MG tablet Take 1 tablet (50 mg total) by mouth 2 (two) times daily as needed. 07/04/19   07/06/19, MD  TURMERIC PO Take by mouth.    [provider]    Allergies    Lisinopril  Review of Systems   Review of Systems  Constitutional: Negative for fever.  Musculoskeletal: Positive for arthralgias. Negative for joint swelling.  Skin: Negative for color change and wound.  Neurological: Negative for weakness and numbness.    Physical Exam Updated Vital Signs BP (!) 147/94   Pulse 84   Temp 98.4 F (36.9 C) (Oral)   Resp 20   Ht 5\' 6"  (1.676 m)   Wt 129.3 kg   LMP 08/15/2019   SpO2 98%   BMI 46.01 kg/m   Physical Exam Vitals and nursing note reviewed. Exam conducted with a chaperone  present.  Constitutional:      General: She is not in acute distress.    Appearance: Normal appearance. She is not ill-appearing.  HENT:     Head: Normocephalic and atraumatic.  Eyes:     General: No scleral icterus.    Conjunctiva/sclera: Conjunctivae normal.  Cardiovascular:     Rate and Rhythm: Normal rate and regular rhythm.     Pulses: Normal pulses.     Heart sounds: Normal heart sounds.  Pulmonary:     Effort: Pulmonary effort is normal. No respiratory distress.     Breath sounds: Normal breath sounds.  Musculoskeletal:     Comments: Right elbow: No significant bony tenderness to palpation.  Tenderness involving her right distal tricep.  Right elbow discomfort reproducible with flexion and extension.  Her flexion and extension is entirely intact with strength preserved against resistance.  No swelling, warmth, erythema, or other overlying skin changes.  Radial pulse intact.  Cannot assess capillary refill due to painted nails.  Assessed radial, ulnar, and median nerves-all intact.  Compartments are soft. Right shoulder: Normal. Right wrist: Normal.  Skin:    General: Skin is dry.     Capillary Refill: Capillary refill takes less than 2 seconds.  Neurological:     General: No focal deficit present.     Mental Status: She is alert and oriented to person, place, and time.     GCS: GCS eye subscore is 4. GCS verbal subscore is 5. GCS motor subscore is 6.  Psychiatric:        Mood and Affect: Mood normal.        Behavior: Behavior normal.        Thought Content: Thought content normal.     ED Results / Procedures / Treatments   Labs (all labs ordered are listed, but only abnormal results are displayed) Labs Reviewed - No data to display  EKG None  Radiology DG Elbow Complete Right  Result Date: 08/22/2019 CLINICAL DATA:  Pain, no injury EXAM: RIGHT ELBOW - COMPLETE 3+ VIEW COMPARISON:  None. FINDINGS: There is no evidence of fracture, dislocation, or joint effusion.  There is no evidence of arthropathy or other focal bone abnormality. Soft tissues are unremarkable. IMPRESSION: Negative. Electronically Signed   By: 10/15/2019 M.D.  On: 08/22/2019 17:46    Procedures Procedures (including critical care time)  Medications Ordered in ED Medications - No data to display  ED Course  I have reviewed the triage vital signs and the nursing notes.  Pertinent labs & imaging results that were available during my care of the patient were reviewed by me and considered in my medical decision making (see chart for details).    MDM Rules/Calculators/A&P                          Patient's history and physical exam is consistent with a triceps tendinitis.  Her history of increased physical exertion at work supports this diagnosis.  Plain films obtained of right elbow are personally reviewed and there is no evidence of arthropathy or other acute osseous abnormalities.  She denies any history of IVDA, fevers, recent GC infection, recent injury/wound, or other history/symptoms that might suggest septic arthritis.  Her ROM is fully intact and I have very low suspicion at this time.  Her physical exam is also inconsistent with inflammatory arthritis such as gout.  Instead, suspect that this is simply an overuse injury, likely triceps tendinitis.    We will place patient in right arm sling for comfort measures and encourage rest and continue Tylenol.  Will refer patient to Duwayne Heck, [facility].  She had already been followed by Dr. August Saucer, but had a recent disagreement with him and would prefer a referral elsewhere.  Strict ED return precautions discussed.  Patient voices understanding and is agreeable to the plan.  Final Clinical Impression(s) / ED Diagnoses Final diagnoses:  Triceps tendonitis    Rx / DC Orders ED Discharge Orders    None       Elvera Maria 08/22/19 2051    Arby Barrette, MD 08/31/19 [phone]

## 2019-08-22 NOTE — ED Triage Notes (Signed)
Right elbow pain for a week. She can reproduce the pain with certain movements.

## 2019-10-20 ENCOUNTER — Other Ambulatory Visit: Payer: Self-pay

## 2019-10-20 DIAGNOSIS — I728 Aneurysm of other specified arteries: Secondary | ICD-10-CM

## 2019-11-15 ENCOUNTER — Other Ambulatory Visit: Payer: No Typology Code available for payment source

## 2019-11-17 ENCOUNTER — Ambulatory Visit: Payer: No Typology Code available for payment source | Admitting: Vascular Surgery

## 2020-01-19 ENCOUNTER — Encounter (HOSPITAL_BASED_OUTPATIENT_CLINIC_OR_DEPARTMENT_OTHER): Payer: Self-pay | Admitting: *Deleted

## 2020-01-19 ENCOUNTER — Other Ambulatory Visit: Payer: Self-pay

## 2020-01-19 ENCOUNTER — Emergency Department (HOSPITAL_BASED_OUTPATIENT_CLINIC_OR_DEPARTMENT_OTHER): Payer: Managed Care, Other (non HMO)

## 2020-01-19 ENCOUNTER — Emergency Department (HOSPITAL_BASED_OUTPATIENT_CLINIC_OR_DEPARTMENT_OTHER)
Admission: EM | Admit: 2020-01-19 | Discharge: 2020-01-19 | Disposition: A | Discharge status: Home / Self Care | Payer: Managed Care, Other (non HMO) | Attending: Emergency Medicine | Admitting: Emergency Medicine

## 2020-01-19 DIAGNOSIS — R059 Cough, unspecified: Secondary | ICD-10-CM | POA: Diagnosis present

## 2020-01-19 DIAGNOSIS — J069 Acute upper respiratory infection, unspecified: Secondary | ICD-10-CM

## 2020-01-19 DIAGNOSIS — Z87891 Personal history of nicotine dependence: Secondary | ICD-10-CM | POA: Diagnosis not present

## 2020-01-19 DIAGNOSIS — I10 Essential (primary) hypertension: Secondary | ICD-10-CM | POA: Insufficient documentation

## 2020-01-19 DIAGNOSIS — U071 COVID-19: Secondary | ICD-10-CM | POA: Insufficient documentation

## 2020-01-19 DIAGNOSIS — Z20822 Contact with and (suspected) exposure to covid-19: Secondary | ICD-10-CM

## 2020-01-19 LAB — RAPID INFLUENZA A&B ANTIGENS
Influenza A (ARMC): NEGATIVE
Influenza B (ARMC): NEGATIVE

## 2020-01-19 MED ORDER — ONDANSETRON 4 MG PO TBDP
ORAL_TABLET | ORAL | Status: AC
  Filled 2020-01-19: qty 1

## 2020-01-19 MED ORDER — ACETAMINOPHEN 325 MG PO TABS
650.0000 mg | ORAL_TABLET | Freq: Once | ORAL | Status: AC
  Administered 2020-01-19: 650 mg via ORAL
  Filled 2020-01-19: qty 2

## 2020-01-19 MED ORDER — ONDANSETRON 4 MG PO TBDP
4.0000 mg | ORAL_TABLET | Freq: Once | ORAL | Status: AC
  Administered 2020-01-19: 4 mg via ORAL

## 2020-01-19 NOTE — ED Triage Notes (Signed)
Cough, fever, chills, congestion, headache, body aches and nausea since yesterday. She feels like she has the flu.

## 2020-01-19 NOTE — Discharge Instructions (Addendum)
At this time there does not appear to be the presence of an emergent medical condition, however there is always the potential for conditions to change. Please read and follow the below instructions.  Please return to the Emergency Department immediately for any new or worsening symptoms. Please be sure to follow up with your Primary Care Provider within one week regarding your visit today; please call their office to schedule an appointment even if you are feeling better for a follow-up visit. Your Covid and flu test are currently in process and should result in the next 1 day.  Please check your MyChart account for your results and discuss them with your primary care provider when available.  Please drink plenty of water to avoid dehydration, get plenty of rest.  If your Covid or flu tests are positive you will need to quarantine for 10 days.  If they are both negative I advised that you continue to quarantine until you are symptom-free for at least 3 days to avoid spread of any other viruses to others. You may use over-the-counter anti-inflammatory such as Tylenol as directed on the packaging to help with your symptoms.  Go to the nearest Emergency Department immediately if: You have fever or chills You have shortness of breath that gets worse. You have very bad or constant: Headache. Ear pain. Pain in your forehead, behind your eyes, and over your cheekbones (sinus pain). Chest pain. You have long-lasting (chronic) lung disease along with any of these: Wheezing. Long-lasting cough. Coughing up blood. A change in your usual mucus. You have a stiff neck. You have changes in your: Vision. Hearing. Thinking. Mood. You have any new/concerning or worsening of symptoms   Please read the additional information packets attached to your discharge summary.  Do not take your medicine if  develop an itchy rash, swelling in your mouth or lips, or difficulty breathing; call 911 and seek immediate  emergency medical attention if this occurs.  You may review your lab tests and imaging results in their entirety on your MyChart account.  Please discuss all results of fully with your primary care provider and other specialist at your follow-up visit.  Note: Portions of this text may have been transcribed using voice recognition software. Every effort was made to ensure accuracy; however, inadvertent computerized transcription errors may still be present.

## 2020-01-19 NOTE — ED Notes (Signed)
Patient denies pain and is resting comfortably.  

## 2020-01-19 NOTE — ED Provider Notes (Signed)
[facility] EMERGENCY DEPARTMENT Provider Note   CSN: [id number] Arrival date & time: 01/19/20  1238     History Chief Complaint  Patient presents with  . URI    Nichole Verge Lalla Stone is a 45 y.o. female history includes fibromyalgia, hypertension, obesity.  Patient presents today for URI symptoms onset yesterday morning she reports mild nonproductive cough without associated shortness of breath or chest pain.  Feeling warm but has not measured a fever, she also endorses chills, nasal congestion.  She describes a moderate intensity constant headache similar to previous headaches, nonradiating no aggravating or alleviating factors she has not attempted any medication prior to arrival for her symptoms.  She also reports generalized body aches as a mild aching sensation constant without clear aggravating or alleviating factors.  She reports that she felt somewhat nauseous in the lobby earlier today but has not had any vomiting.  She reports that she is Covid vaccinated x2 in August 2021, has not had influenza vaccine.  Denies vision changes, neck stiffness, sore throat, chest pain/shortness of breath, hemoptysis, abdominal pain, vomiting, diarrhea, dysuria/hematuria, extremity swelling/color change, numbness/tingling, weakness, difficulty speaking, balance issues or any additional concerns.  HPI     Past Medical History:  Diagnosis Date  . Fibromyalgia   . Fibromyalgia   . Hypertension    was taken off her meds she states by dr  . Obesity     There are no problems to display for this patient.   Past Surgical History:  Procedure Laterality Date  . CHOLECYSTECTOMY    . HAND SURGERY    . TONSILLECTOMY    . TUBAL LIGATION       OB History    Gravida  2   Para  2   Term      Preterm      AB      Living  2     SAB      IAB      Ectopic      Multiple      Live Births              Family History  Problem Relation Age of Onset  . Diabetes  Mother   . Hypertension Mother   . Cirrhosis Mother   . Cancer Mother        Liver  . Diabetes Father   . Hypertension Father   . Parkinson's disease Father   . Dementia Father   . Kidney failure Sister   . Diabetes Sister     Social History   Tobacco Use  . Smoking status: Former Smoker    Packs/day: 0.25    Years: 10.00    Pack years: 2.50    Types: Cigarettes  . Smokeless tobacco: Never Used  Vaping Use  . Vaping Use: Never used  Substance Use Topics  . Alcohol use: Not Currently    Comment: occ  . Drug use: Never    Home Medications Prior to Admission medications   Medication Sig Start Date End Date Taking? Authorizing Provider  HYDROcodone-acetaminophen (NORCO/VICODIN) 5-325 MG tablet Take 1 tablet by mouth every 4 (four) hours as needed. 06/03/19   Jacalyn Lefevre, MD  ibuprofen (ADVIL) 600 MG tablet Take 1 tablet (600 mg total) by mouth every 6 (six) hours as needed. 06/03/19   Jacalyn Lefevre, MD  naproxen (NAPROSYN) 500 MG tablet Take 1 tablet (500 mg total) by mouth 2 (two) times daily. 05/11/19   Dartha Lodge,  PA-C  norethindrone-ethinyl estradiol (LOESTRIN) 1-20 MG-MCG tablet Take one tablet by mouth daily skipping placebo week in 1st and 2nd pack.(for continuous use)  In 3rd pack take 1 tablet daily including placebo pills. 05/17/19   Theresia Majors, MD  ondansetron (ZOFRAN ODT) 4 MG disintegrating tablet 4mg  ODT q4 hours prn nausea/vomit 05/11/19   05/13/19, PA-C  traMADol (ULTRAM) 50 MG tablet Take 1 tablet (50 mg total) by mouth 2 (two) times daily as needed. 07/04/19   07/06/19, MD  TURMERIC PO Take by mouth.    [provider]    Allergies    Lisinopril  Review of Systems   Review of Systems  Constitutional: Positive for fatigue and fever (Subjective). Negative for chills.  HENT: Positive for rhinorrhea. Negative for sore throat, trouble swallowing and voice change.   Eyes: Negative.  Negative for visual disturbance.  Respiratory:  Positive for cough. Negative for shortness of breath.   Cardiovascular: Negative.  Negative for chest pain and leg swelling.  Gastrointestinal: Positive for nausea (Resolved). Negative for abdominal pain, diarrhea and vomiting.  Musculoskeletal: Positive for arthralgias and myalgias. Negative for neck stiffness.  Neurological: Positive for headaches. Negative for syncope, weakness and numbness.    Physical Exam Updated Vital Signs BP 136/81 (BP Location: Right Arm)   Pulse (!) 108   Temp 99.2 F (37.3 C) (Oral)   Resp 18   Ht 5\' 6"  (1.676 m)   Wt 133.5 kg   LMP 01/18/2020   SpO2 96%   BMI 47.50 kg/m   Physical Exam Constitutional:      General: She is not in acute distress.    Appearance: Normal appearance. She is well-developed. She is not ill-appearing or diaphoretic.  HENT:     Head: Normocephalic and atraumatic.     Jaw: There is normal jaw occlusion. No trismus.     Right Ear: External ear normal.     Left Ear: External ear normal.     Nose: Rhinorrhea present. Rhinorrhea is clear.     Right Sinus: No maxillary sinus tenderness or frontal sinus tenderness.     Left Sinus: No maxillary sinus tenderness or frontal sinus tenderness.     Mouth/Throat:     Mouth: Mucous membranes are moist.     Pharynx: Oropharynx is clear. Uvula midline.  Eyes:     General: Vision grossly intact. Gaze aligned appropriately.     Extraocular Movements: Extraocular movements intact.     Conjunctiva/sclera: Conjunctivae normal.     Pupils: Pupils are equal, round, and reactive to light.  Neck:     Trachea: Trachea and phonation normal. No tracheal tenderness or tracheal deviation.  Cardiovascular:     Rate and Rhythm: Normal rate and regular rhythm.  Pulmonary:     Effort: Pulmonary effort is normal. No respiratory distress.     Breath sounds: Normal breath sounds and air entry.  Abdominal:     General: There is no distension.     Palpations: Abdomen is soft.     Tenderness: There is  no abdominal tenderness. There is no guarding or rebound.  Musculoskeletal:        General: Normal range of motion.     Cervical back: Normal range of motion and neck supple.     Right lower leg: No edema.     Left lower leg: No edema.  Feet:     Right foot:     Protective Sensation: 3 sites tested. 3 sites sensed.  Left foot:     Protective Sensation: 3 sites tested. 3 sites sensed.  Skin:    General: Skin is warm and dry.  Neurological:     Mental Status: She is alert.     GCS: GCS eye subscore is 4. GCS verbal subscore is 5. GCS motor subscore is 6.     Comments: Speech is clear and goal oriented, follows commands Major Cranial nerves without deficit, no facial droop Normal strength in upper and lower extremities bilaterally including dorsiflexion and plantar flexion, strong and equal grip strength Sensation normal to light and sharp touch Moves extremities without ataxia, coordination intact Normal finger to nose and rapid alternating movements Neg romberg, no pronator drift Normal gait Normal heel-shin and balance  Psychiatric:        Behavior: Behavior normal.    ED Results / Procedures / Treatments   Labs (all labs ordered are listed, but only abnormal results are displayed) Labs Reviewed  SARS CORONAVIRUS 2 (TAT 6-24 HRS)  RAPID INFLUENZA A&B ANTIGENS    EKG None  Radiology DG Chest Portable 1 View  Result Date: 01/19/2020 CLINICAL DATA:  Cough.  Fever and chills. EXAM: PORTABLE CHEST 1 VIEW COMPARISON:  January 18, 2019 FINDINGS: The heart size and mediastinal contours are within normal limits. Both lungs are clear. The visualized skeletal structures are unremarkable. IMPRESSION: No active disease. Electronically Signed   By: Dorise Bullion III M.D   On: 01/19/2020 17:43    Procedures Procedures (including critical care time)  Medications Ordered in ED Medications  ondansetron (ZOFRAN-ODT) disintegrating tablet 4 mg (4 mg Oral Given 01/19/20 1540)   acetaminophen (TYLENOL) tablet 650 mg (650 mg Oral Given 01/19/20 1840)    ED Course  I have reviewed the triage vital signs and the nursing notes.  Pertinent labs & imaging results that were available during my care of the patient were reviewed by me and considered in my medical decision making (see chart for details).    MDM Rules/Calculators/A&P                          Additional history obtained from: 1. Nursing notes from this visit. ------------------------- 45 year old female presents with 1 day of URI symptoms chief complaints are headache, body aches, nonproductive cough, rhinorrhea/congestion and nausea.  She is not attempted any medications prior to arrival.  On exam patient is well-appearing no acute distress.  Cranial nerves intact, no meningeal signs.  Airway clear without evidence of PTA, RPA, Ludewig's, sinusitis or other deep space infections of the head or neck.  Cardiopulmonary exam is unremarkable.  Abdomen soft nontender.  Neurovascular intact to all 4 extremities without evidence of DVT.  Patient covered vaccinated x2 around 5 months ago, has not received influenza vaccine.  Covid test ordered in triage would result tomorrow, will also order influenza panel and discharge patient to allow her to follow-up on her test results on her MyChart account.  Will give patient Tylenol for headache as she has not taken anything PTA.  Encourage patient to drink plenty water get plenty of rest and call PCP today to schedule follow-up appointment for next week.  Quarantine precautions and isolation discussed.  Chest x-ray ordered in triage is negative for acute findings.  Suspect patient symptoms are due to viral URI there is no indication for blood work or further work-up.  Doubt bacterial infection require antibiotics.  At this time there does not appear to be any evidence of  an acute emergency medical condition and the patient appears stable for discharge with appropriate outpatient  follow up. Diagnosis was discussed with patient who verbalizes understanding of care plan and is agreeable to discharge. I have discussed return precautions with patient who verbalizes understanding. Patient encouraged to follow-up with their PCP. All questions answered.   Nichole Stone was evaluated in Emergency Department on 01/19/2020 for the symptoms described in the history of present illness. She was evaluated in the context of the global COVID-19 pandemic, which necessitated consideration that the patient might be at risk for infection with the SARS-CoV-2 virus that causes COVID-19. Institutional protocols and algorithms that pertain to the evaluation of patients at risk for COVID-19 are in a state of rapid change based on information released by regulatory bodies including the CDC and federal and state organizations. These policies and algorithms were followed during the patient's care in the ED.  Note: Portions of this report may have been transcribed using voice recognition software. Every effort was made to ensure accuracy; however, inadvertent computerized transcription errors may still be present. Final Clinical Impression(s) / ED Diagnoses Final diagnoses:  Viral URI with cough  Encounter for laboratory testing for COVID-19 virus    Rx / DC Orders ED Discharge Orders    None       Elizabeth Palau 01/19/20 1841    Tilden Fossa, MD 01/19/20 1902

## 2020-01-20 LAB — SARS CORONAVIRUS 2 (TAT 6-24 HRS): SARS Coronavirus 2: POSITIVE — AB

## 2020-04-24 ENCOUNTER — Ambulatory Visit: Payer: No Typology Code available for payment source | Admitting: Surgery

## 2020-05-15 ENCOUNTER — Encounter: Payer: Self-pay | Admitting: Surgery

## 2020-05-15 ENCOUNTER — Ambulatory Visit (INDEPENDENT_AMBULATORY_CARE_PROVIDER_SITE_OTHER): Payer: Managed Care, Other (non HMO) | Admitting: Surgery

## 2020-05-15 ENCOUNTER — Other Ambulatory Visit: Payer: Self-pay

## 2020-05-15 VITALS — HR 70 | Ht 66.0 in | Wt 294.3 lb

## 2020-05-15 DIAGNOSIS — M23301 Other meniscus derangements, unspecified lateral meniscus, left knee: Secondary | ICD-10-CM | POA: Diagnosis not present

## 2020-05-15 DIAGNOSIS — M25562 Pain in left knee: Secondary | ICD-10-CM

## 2020-05-15 NOTE — Progress Notes (Signed)
Office Visit Note   Patient: Nichole Stone           Date of Birth: [birth year]           MRN: [id number] Visit Date: 05/15/2020              Requested by: Sallyanne Havers, NP [id number] [place],  [place] [address] PCP: Sallyanne Havers, NP   Assessment & Plan: Visit Diagnoses:  1. Mechanical knee pain, left   2. 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  After patient consent left knee was prepped with Betadine and intra-articular Marcaine/Depo-Medrol injection performed from an anterolateral approach.  Tolerated without complication.  I will have patient follow-up with me in 4 weeks for recheck.  If she still continues to be symptomatic I will repeat left knee MRI and compare to the study that was done July 2021 to see if there has been progression of her DJD or lateral meniscal tear.  She is complaining of more pain in the lateral compartment.  Follow-Up Instructions: Return in about 4 weeks (around 06/12/2020) for with Lee Kuang recheck left knee after injection. .   Orders:  Orders Placed This Encounter  Procedures  . Large Joint Inj: L knee   No orders of the defined types were placed in this encounter.     Procedures: Large Joint Inj: L knee on 05/15/2020 10:50 AM Indications: pain Details: 25 G 1.5 in needle, anterolateral approach Medications: 3 mL lidocaine 1 %; 6 mL bupivacaine 0.25 %; 40 mg methylPREDNISolone acetate 40 MG/ML Outcome: tolerated well, no immediate complications Consent was given by the patient. Patient was prepped and draped in the usual sterile fashion.       Clinical Data: No additional findings.   Subjective: Chief Complaint  Patient presents with  . Left Knee - Pain    HPI 45 year old female returns with complaints of left knee pain and mechanical symptoms.  I had  seen patient for the same symptoms June 14, 2019 and after trying conservative management with intra-articular Marcaine/Depo-Medrol injection I eventually went on to order left knee MRI scan due to persistent symptoms.  That study from July 21, 2019 showed:  CLINICAL DATA:  Left knee pain along the medial and lateral aspect for 2-3 months.  EXAM: MRI OF THE LEFT KNEE WITHOUT CONTRAST  TECHNIQUE: Multiplanar, multisequence MR imaging of the knee was performed. No intravenous contrast was administered.  COMPARISON:  None.  FINDINGS: MENISCI  Medial meniscus:  Intact.  Lateral meniscus: Slight attenuation of the root of the posterior horn of the lateral meniscus which may reflect degeneration versus a small partial radial tear.  LIGAMENTS  Cruciates: Intact ACL which is increased in signal and mildly expanded as can be seen with mucinous degeneration. Intact PCL.  Collaterals: Medial collateral ligament is intact. Lateral collateral ligament complex is intact.  CARTILAGE  Patellofemoral: Partial-thickness cartilage loss of the patellofemoral compartment.  Medial: Mild partial-thickness cartilage loss of the medial femorotibial compartment.  Lateral: Mild partial-thickness cartilage loss of the lateral femorotibial compartment.  Joint: Small joint effusion. Mild edema in Hoffa's fat. No plical thickening.  Popliteal Fossa: No significant baker cyst. Intact popliteus tendon.  Extensor Mechanism: Intact quadriceps tendon. Intact patellar tendon. Intact medial patellar retinaculum. Intact lateral patellar retinaculum. Intact MPFL.  Bones: No acute osseous abnormality. No  aggressive osseous lesion. Tiny tricompartmental marginal osteophytes.  Other: No fluid collection or hematoma. Muscles are normal.  IMPRESSION: 1. Slight attenuation of the root of the posterior horn of the lateral meniscus which may reflect degeneration versus a small partial  radial tear. 2. Tricompartmental cartilage abnormalities as described above.   Electronically Signed   By: Elige Ko   On: 07/22/2019 07:43  I had referred patient to Dr. Dorene Grebe to discuss potential treatment options.  He did not think that there was enough on her scan that would lend itself to outpatient arthroscopy.  Patient states that she has continued to have ongoing symptoms more so in the lateral knee with some feeling of catching and the knee wants to "give away".  She does not mention a new injury.   Review of Systems No current cardiac pulmonary GI GU issues  Objective: Vital Signs: Pulse 70   Ht 5\' 6"  (1.676 m)   Wt 294 lb 4.8 oz (133.5 kg)   BMI 47.50 kg/m   Physical Exam HENT:     Head: Normocephalic.  Pulmonary:     Effort: No respiratory distress.  Musculoskeletal:     Comments: Gait is somewhat antalgic.  Left knee range of motion about 0 to 90 degrees.  She does have some swelling with small effusion.  Lateral greater than medial joint line tenderness.  Positive McMurray's test.  collateral ligaments are stable.  Positive patellofemoral crepitus.  Neurological:     General: No focal deficit present.     Mental Status: She is oriented to person, place, and time.  Psychiatric:        Mood and Affect: Mood normal.     Ortho Exam  Specialty Comments:  No specialty comments available.  Imaging: No results found.   PMFS History: There are no problems to display for this patient.  Past Medical History:  Diagnosis Date  . Fibromyalgia   . Fibromyalgia   . Hypertension    was taken off her meds she states by dr  . Obesity     Family History  Problem Relation Age of Onset  . Diabetes Mother   . Hypertension Mother   . Cirrhosis Mother   . Cancer Mother        Liver  . Diabetes Father   . Hypertension Father   . Parkinson's disease Father   . Dementia Father   . Kidney failure Sister   . Diabetes Sister     Past Surgical  History:  Procedure Laterality Date  . CHOLECYSTECTOMY    . HAND SURGERY    . TONSILLECTOMY    . TUBAL LIGATION     Social History   Occupational History  . Not on file  Tobacco Use  . Smoking status: Former Smoker    Packs/day: 0.25    Years: 10.00    Pack years: 2.50    Types: Cigarettes  . Smokeless tobacco: Never Used  Vaping Use  . Vaping Use: Never used  Substance and Sexual Activity  . Alcohol use: Not Currently    Comment: occ  . Drug use: Never  . Sexual activity: Yes    Birth control/protection: Surgical    Comment: 1st intercourse 45 yo-More than 5 partners

## 2020-05-17 MED ORDER — BUPIVACAINE HCL 0.25 % IJ SOLN
6.0000 mL | INTRAMUSCULAR | Status: AC | PRN
  Administered 2020-05-15: 6 mL via INTRA_ARTICULAR

## 2020-05-17 MED ORDER — METHYLPREDNISOLONE ACETATE 40 MG/ML IJ SUSP
40.0000 mg | INTRAMUSCULAR | Status: AC | PRN
  Administered 2020-05-15: 40 mg via INTRA_ARTICULAR

## 2020-05-17 MED ORDER — LIDOCAINE HCL 1 % IJ SOLN
3.0000 mL | INTRAMUSCULAR | Status: AC | PRN
  Administered 2020-05-15: 3 mL

## 2020-05-23 ENCOUNTER — Telehealth: Payer: Self-pay | Admitting: Surgery

## 2020-05-23 NOTE — Telephone Encounter (Signed)
Pt states that she feels more pain now then she ever did before the injection and that it is not working. Cb [phone]

## 2020-05-23 NOTE — Telephone Encounter (Signed)
I spoke with Nichole Stone, he states that she needs to use ice, and motrin or any OTC NSAIDs, and if it doesn't improve then he would order an MRI. I called and advised patient of this and she states that she normally feels different after the injections but this time it is not like that. But she said she would give it another week and see how it is.

## 2020-05-29 ENCOUNTER — Telehealth: Payer: Self-pay | Admitting: Surgery

## 2020-05-29 NOTE — Telephone Encounter (Signed)
Pt called stating she called a week ago saying the inj she had got did nothing to help and even had her in more pain. Pt was instructed some things to try at home and give it a week; pt states its been a week now and she's still pain. Pt would like a CB to be advised what's the next step and she would like a CB as soon as possible please.   [phone]

## 2020-05-29 NOTE — Telephone Encounter (Signed)
Pt called to advise that she is still in extreme pain.

## 2020-05-30 NOTE — Telephone Encounter (Signed)
Can you please have Fayrene Fearing to advise of this?

## 2020-05-30 NOTE — Telephone Encounter (Signed)
Please advise 

## 2020-06-05 ENCOUNTER — Telehealth: Payer: Self-pay | Admitting: Surgery

## 2020-06-05 DIAGNOSIS — M25562 Pain in left knee: Secondary | ICD-10-CM

## 2020-06-05 NOTE — Telephone Encounter (Signed)
Pt called stating she was still having pain in her knee after seeing Dr. Barry Dienes on 5/3. Dr. Barry Dienes told pt at appt she would need an MRI as the next step if it did not get better so she is wanting to speak with the nurse or Dr to verify what the next steps would be as soon as possible as she is in a lot of pain. The best call back number is [phone]

## 2020-06-06 NOTE — Telephone Encounter (Signed)
Please advise. Injection provided no relief. Proceed with MRI? What are next steps?

## 2020-06-06 NOTE — Telephone Encounter (Signed)
Please go ahead and order MRI of the knee injected.  Patient needs return office visit with Dr. Ophelia Charter after to discuss results and treatment options.

## 2020-06-06 NOTE — Telephone Encounter (Signed)
I called patient and advised. MRI order entered. She will call back to office to schedule follow up appt with Dr. Ophelia Charter to review MRI once she has MRI scheduled.

## 2020-06-11 ENCOUNTER — Telehealth: Payer: Self-pay | Admitting: Surgery

## 2020-06-11 NOTE — Telephone Encounter (Signed)
Patient called asked if she can get a note e-mailed to her employer stating that she will need to sit down several times through the day. Patient asked if she can take a 5 minute break throughout her day. Patient said she stands all day.   The e-mail address is [email or web address]    Attn: Kellyanne     The number to contact patient is [phone]

## 2020-06-11 NOTE — Telephone Encounter (Signed)
Please advise 

## 2020-06-16 ENCOUNTER — Other Ambulatory Visit: Payer: No Typology Code available for payment source

## 2020-06-17 ENCOUNTER — Telehealth: Payer: Self-pay | Admitting: Orthopaedic Surgery

## 2020-06-17 NOTE — Telephone Encounter (Signed)
Patient called asked that appointment be canceled. Patient asked for a call back tomorrow. The number to contact patient is [phone]

## 2020-06-17 NOTE — Telephone Encounter (Signed)
I called patient. She is on schedule for tomorrow afternoon with Dr. Ophelia Charter to review MRI, however, MRI is still pending authorization and has not been completed. I was going to cancel that appointment and advise patient, per Fayrene Fearing, he can take her out of work if knee is bothering her that badly, until follow up with Dr. Ophelia Charter to review MRI.  No answer and voicemail is not set up. Will try patient again or wait for return call.

## 2020-06-18 ENCOUNTER — Ambulatory Visit: Payer: No Typology Code available for payment source | Admitting: Orthopaedic Surgery

## 2020-06-18 NOTE — Telephone Encounter (Signed)
OK for note to occasionally sit . thanks

## 2020-06-18 NOTE — Telephone Encounter (Signed)
Can you advise on work note? Patient does not want to be out of work in case she needs to have surgery. She is requesting a note for work that states to allow her to sit during the day as needed due to her knee pain. MRI is still pending insurance approval.  OK for note?

## 2020-06-18 NOTE — Telephone Encounter (Signed)
Duplicate message in chart.  

## 2020-06-18 NOTE — Telephone Encounter (Signed)
Note entered and emailed per patient request. I called patient and advised.  Sabrina--any word on authorization for the MRI from [employer] yet?  I did advise patient she should call her insurance company and inquire as well.

## 2020-06-19 NOTE — Telephone Encounter (Signed)
I have sent rejection of referral to Fayrene Fearing to advise.

## 2020-06-19 NOTE — Telephone Encounter (Signed)
Please see referral message for update. thanks

## 2020-06-25 ENCOUNTER — Telehealth: Payer: Self-pay

## 2020-06-25 NOTE — Telephone Encounter (Signed)
Please advise. I sent you a previous message on her for peer to peer.

## 2020-06-25 NOTE — Telephone Encounter (Signed)
Patient called Nichole Stone stated Nichole Stone spoke to her insurance regarding a referral for her mri patient stated her insurance told her Fayrene Fearing needs to do a peer to peer due to the authorization being denied.  [phone] call back:[phone]

## 2020-06-26 NOTE — Telephone Encounter (Signed)
I called patient and advised. She states that she cannot do formal PT because she cannot be out of work so much for appointments. Her pain has gotten so much worse and she is unable to do stairs. She requests sooner appt with Dr. Ophelia Charter to follow up. Appt made for 6/28.

## 2020-06-26 NOTE — Telephone Encounter (Signed)
Duplicate message in chart.  

## 2020-07-09 ENCOUNTER — Ambulatory Visit: Payer: No Typology Code available for payment source | Admitting: Orthopaedic Surgery

## 2020-07-10 ENCOUNTER — Ambulatory Visit (INDEPENDENT_AMBULATORY_CARE_PROVIDER_SITE_OTHER): Payer: Managed Care, Other (non HMO) | Admitting: Orthopaedic Surgery

## 2020-07-10 ENCOUNTER — Telehealth: Payer: Self-pay | Admitting: Radiology

## 2020-07-10 ENCOUNTER — Other Ambulatory Visit: Payer: Self-pay

## 2020-07-10 ENCOUNTER — Encounter: Payer: Self-pay | Admitting: Orthopaedic Surgery

## 2020-07-10 ENCOUNTER — Ambulatory Visit (INDEPENDENT_AMBULATORY_CARE_PROVIDER_SITE_OTHER): Payer: Managed Care, Other (non HMO)

## 2020-07-10 ENCOUNTER — Telehealth: Payer: Self-pay | Admitting: Orthopaedic Surgery

## 2020-07-10 VITALS — BP 177/80 | Ht 65.0 in | Wt 300.0 lb

## 2020-07-10 DIAGNOSIS — M25562 Pain in left knee: Secondary | ICD-10-CM

## 2020-07-10 NOTE — Progress Notes (Signed)
Office Visit Note   Patient: Nichole Stone           Date of Birth: 1975/03/19           MRN: [id number] Visit Date: 07/10/2020              Requested by: Sallyanne Havers, NP [address] [place],  [place] [address] PCP: Sallyanne Havers, NP   Assessment & Plan: Visit Diagnoses:  1. Left knee pain, unspecified chronicity     Plan: Patient states she really cannot afford to go to therapy have the time off.  We could try Visco supplement injection and see if this gives her some relief.  We discussed importance of working on weight loss.  We discussed pool exercising or swimming.  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  We will seek approval for single Visco injection left knee and follow her up in 2 months.  Follow-Up Instructions: No follow-ups on file.   Orders:  Orders Placed This Encounter  Procedures   XR KNEE 3 VIEW LEFT   No orders of the defined types were placed in this encounter.     Procedures: No procedures performed   Clinical Data: No additional findings.   Subjective: Chief Complaint  Patient presents with   Left Knee - Pain    HPI 45 year old female seen with ongoing problems with left knee pain.  Nichole Kief, PA-C had ordered MRI scan which was denied by insurance and their note states patient needed therapy and new x-rays.  Patient had lateral joint line narrowing tricompartmental partial-thickness cartilage loss.  There was some attenuation of the posterior horn of the lateral meniscus degenerative in nature.  Patellofemoral degenerative changes.  Patient is used Tylenol without relief.  BMI is 49 weight is 300 and target weight to have BMI below 40 would be for her to diet and reach 240 pounds.  She has had previous cortisone injection.  She stands at work.  She states she was previously given a note that she could sit down sometimes.  Review of Systems all other systems noncontributory to HPI  no history of gout.   Objective: Vital Signs: BP (!) 177/80   Ht 5\' 5"  (1.651 m)   Wt 300 lb (136.1 kg)   BMI 49.92 kg/m   Physical Exam Constitutional:      Appearance: She is well-developed.  HENT:     Head: Normocephalic.     Right Ear: External ear normal.     Left Ear: External ear normal. There is no impacted cerumen.  Eyes:     Pupils: Pupils are equal, round, and reactive to light.  Neck:     Thyroid: No thyromegaly.     Trachea: No tracheal deviation.  Cardiovascular:     Rate and Rhythm: Normal rate.  Pulmonary:     Effort: Pulmonary effort is normal.  Abdominal:     Palpations: Abdomen is soft.  Musculoskeletal:     Cervical back: No rigidity.  Skin:    General: Skin is warm and dry.  Neurological:     Mental Status: She is alert and oriented to person, place, and time.  Psychiatric:        Behavior: Behavior normal.    Ortho Exam patient has trace effusion left knee mild crepitus with flexion extension full extension negative logroll the hips pulses are normal.  Negative straight leg raising 90 degrees  no palpable Baker's cyst.  Specialty Comments:  No specialty comments available.  Imaging: No results found.   PMFS History: There are no problems to display for this patient.  Past Medical History:  Diagnosis Date   Fibromyalgia    Fibromyalgia    Hypertension    was taken off her meds she states by dr   Obesity     Family History  Problem Relation Age of Onset   Diabetes Mother    Hypertension Mother    Cirrhosis Mother    Cancer Mother        Liver   Diabetes Father    Hypertension Father    Parkinson's disease Father    Dementia Father    Kidney failure Sister    Diabetes Sister     Past Surgical History:  Procedure Laterality Date   CHOLECYSTECTOMY     HAND SURGERY     TONSILLECTOMY     TUBAL LIGATION     Social History   Occupational History   Not on file  Tobacco Use   Smoking status: Former    Packs/day: 0.25     Years: 10.00    Pack years: 2.50    Types: Cigarettes   Smokeless tobacco: Never  Vaping Use   Vaping Use: Never used  Substance and Sexual Activity   Alcohol use: Not Currently    Comment: occ   Drug use: Never   Sexual activity: Yes    Birth control/protection: Surgical    Comment: 1st intercourse 45 yo-More than 5 partners

## 2020-07-10 NOTE — Telephone Encounter (Signed)
Please advise 

## 2020-07-10 NOTE — Telephone Encounter (Signed)
Please seek approval for gel injection left knee-[facility] Thanks.

## 2020-07-10 NOTE — Telephone Encounter (Signed)
Pt had an appt this morning with Dr. Ophelia Charter. Pt called stating her work is needing a new work note stating what she is limited to doing. Pt stated she received one from Fayrene Fearing previously but work was requesting a new one following the appt from this morning. The best call back number is [phone].

## 2020-07-11 NOTE — Telephone Encounter (Signed)
Note entered

## 2020-07-12 NOTE — Telephone Encounter (Signed)
Noted  

## 2020-07-23 ENCOUNTER — Ambulatory Visit: Payer: No Typology Code available for payment source | Admitting: Orthopaedic Surgery

## 2020-07-26 ENCOUNTER — Other Ambulatory Visit: Payer: Self-pay

## 2020-07-26 DIAGNOSIS — I8393 Asymptomatic varicose veins of bilateral lower extremities: Secondary | ICD-10-CM

## 2020-08-02 ENCOUNTER — Other Ambulatory Visit: Payer: Self-pay

## 2020-08-02 ENCOUNTER — Ambulatory Visit (INDEPENDENT_AMBULATORY_CARE_PROVIDER_SITE_OTHER): Payer: Managed Care, Other (non HMO) | Admitting: Vascular Surgery

## 2020-08-02 ENCOUNTER — Telehealth: Payer: Self-pay

## 2020-08-02 ENCOUNTER — Ambulatory Visit (HOSPITAL_COMMUNITY)
Admission: RE | Admit: 2020-08-02 | Discharge: 2020-08-02 | Disposition: A | Discharge status: Home / Self Care | Payer: Managed Care, Other (non HMO) | Source: Ambulatory Visit | Attending: Vascular Surgery | Admitting: Vascular Surgery

## 2020-08-02 ENCOUNTER — Encounter: Payer: Self-pay | Admitting: Vascular Surgery

## 2020-08-02 VITALS — BP 154/91 | HR 71 | Temp 98.1°F | Resp 20 | Ht 65.0 in | Wt 306.6 lb

## 2020-08-02 DIAGNOSIS — I728 Aneurysm of other specified arteries: Secondary | ICD-10-CM

## 2020-08-02 DIAGNOSIS — I8393 Asymptomatic varicose veins of bilateral lower extremities: Secondary | ICD-10-CM | POA: Insufficient documentation

## 2020-08-02 NOTE — Progress Notes (Signed)
Patient ID: Nichole Stone, female   DOB: June 16, 1975, 45 y.o.   MRN: [id number]  Reason for Consult: New Patient (Initial Visit) and Varicose Veins   Referred by Sallyanne Havers, NP  Subjective:     HPI:  Nichole Stone is a 45 y.o. female has a history of left sided abdominal pain she was evaluated previously and found to have splenic artery aneurysm.  We will plan for repeat CT scan this was not performed.  She now returns for evaluation of left thigh varicose veins.  She has not had any bleeding or thrombosis.  Her mother did have varicose veins.  The patient's veins appeared after giving birth  Past Medical History:  Diagnosis Date   Fibromyalgia    Fibromyalgia    Hypertension    was taken off her meds she states by dr   Obesity    Family History  Problem Relation Age of Onset   Diabetes Mother    Hypertension Mother    Cirrhosis Mother    Cancer Mother        Liver   Diabetes Father    Hypertension Father    Parkinson's disease Father    Dementia Father    Kidney failure Sister    Diabetes Sister    Past Surgical History:  Procedure Laterality Date   CHOLECYSTECTOMY     HAND SURGERY     TONSILLECTOMY     TUBAL LIGATION      Short Social History:  Social History   Tobacco Use   Smoking status: Former    Packs/day: 0.25    Years: 10.00    Pack years: 2.50    Types: Cigarettes   Smokeless tobacco: Never  Substance Use Topics   Alcohol use: Not Currently    Comment: occ    Allergies  Allergen Reactions   Lisinopril Cough    No current outpatient medications on file.   No current facility-administered medications for this visit.    Review of Systems  Constitutional:  Constitutional negative. HENT: HENT negative.  Eyes: Eyes negative.  Respiratory: Respiratory negative.  Cardiovascular: Cardiovascular negative.  GI: Gastrointestinal negative.  Musculoskeletal: Musculoskeletal negative.  Neurological: Neurological  negative. Hematologic: Hematologic/lymphatic negative.  Psychiatric: Psychiatric negative.       Objective:  Objective   Vitals:   08/02/20 1340  BP: (!) 154/91  Pulse: 71  Resp: 20  Temp: 98.1 F (36.7 C)  SpO2: 91%  Weight: (!) 306 lb 9.6 oz (139.1 kg)  Height: 5\' 5"  (1.651 m)   Body mass index is 51.02 kg/m.  Physical Exam HENT:     Head: Normocephalic.     Nose:     Comments: Wearing a mask Eyes:     Pupils: Pupils are equal, round, and reactive to light.  Cardiovascular:     Rate and Rhythm: Normal rate.     Pulses: Normal pulses.  Pulmonary:     Effort: Pulmonary effort is normal.  Abdominal:     General: Abdomen is flat.     Palpations: Abdomen is soft. There is no mass.  Musculoskeletal:     Comments: Large varicosity on lateral aspect of left thigh  Skin:    General: Skin is warm.     Capillary Refill: Capillary refill takes less than 2 seconds.  Neurological:     General: No focal deficit present.     Mental Status: She is alert.  Psychiatric:        Mood  and Affect: Mood normal.        Behavior: Behavior normal.        Thought Content: Thought content normal.    Data: Summary:  Left:  - No evidence of deep vein thrombosis seen in the left lower extremity,  from the common femoral through the popliteal veins.  - No evidence of superficial venous thrombosis in the left lower  extremity.  - Deep vein reflux in the CFV. -Superficial vein reflux in the SFJ and the  GSV in the proximal calf.         Assessment/Plan:     45 year old female with known splenic artery aneurysm.  She had now presents for evaluation of left thigh varicose vein which appears possibly associated with an accessory vein as there is no significant reflux in the greater saphenous vein and the vein of interest is quite lateral in the thigh.  She is asymptomatic from this and is not interested in any intervention she was just concerned that it may be dangerous.  All questions  were answered regarding the vein.  As a pertains to her aneurysm we will get a CT scan to reevaluate now that it has been 2 years.     Maeola Harman MD [facility]

## 2020-08-02 NOTE — Telephone Encounter (Signed)
VOB submitted for Durolane, left knee. Pending BV. 

## 2020-08-06 ENCOUNTER — Telehealth: Payer: Self-pay

## 2020-08-06 NOTE — Telephone Encounter (Signed)
PA required for Durolane, left knee. PA submitted online through [email or web address] portal. PA Pending

## 2020-08-09 ENCOUNTER — Telehealth: Payer: Self-pay

## 2020-08-09 NOTE — Telephone Encounter (Addendum)
Approved for Durolane, left knee. Buy & Bill Patient will be responsible for 20% OOP. No Co-pay PA Approval# [id number] Valid 08/05/2020- 08/26/2020  Appt. 08/14/2020 with Dr. Ophelia Charter

## 2020-08-14 ENCOUNTER — Other Ambulatory Visit: Payer: Self-pay

## 2020-08-14 ENCOUNTER — Ambulatory Visit (INDEPENDENT_AMBULATORY_CARE_PROVIDER_SITE_OTHER): Payer: Managed Care, Other (non HMO) | Admitting: Orthopaedic Surgery

## 2020-08-14 ENCOUNTER — Encounter: Payer: Self-pay | Admitting: Orthopaedic Surgery

## 2020-08-14 DIAGNOSIS — M1712 Unilateral primary osteoarthritis, left knee: Secondary | ICD-10-CM

## 2020-08-14 MED ORDER — LIDOCAINE HCL 1 % IJ SOLN
0.5000 mL | INTRAMUSCULAR | Status: AC | PRN
  Administered 2020-08-14: .5 mL

## 2020-08-14 MED ORDER — SODIUM HYALURONATE 60 MG/3ML IX PRSY
60.0000 mg | PREFILLED_SYRINGE | INTRA_ARTICULAR | Status: AC | PRN
  Administered 2020-08-14: 60 mg via INTRA_ARTICULAR

## 2020-08-14 NOTE — Progress Notes (Signed)
Office Visit Note   Patient: Nichole Stone           Date of Birth: November 29, 1975           MRN: [id number] Visit Date: 08/14/2020              Requested by: No referring provider defined for this encounter. PCP: No primary care provider on file.   Assessment & Plan: Visit Diagnoses:  1. Arthritis of left knee     Plan: Left knee Durolane injection performed.  She tolerated well.  Follow-Up Instructions: No follow-ups on file.   Orders:  Orders Placed This Encounter  Procedures   Large Joint Inj   No orders of the defined types were placed in this encounter.     Procedures: Large Joint Inj: L knee on 08/14/2020 11:25 AM Indications: joint swelling and pain Details: 22 G 1.5 in needle, anterolateral approach  Arthrogram: No  Medications: 0.5 mL lidocaine 1 %; 60 mg Sodium Hyaluronate 60 MG/3ML Outcome: tolerated well, no immediate complications Procedure, treatment alternatives, risks and benefits explained, specific risks discussed. Consent was given by the patient. Immediately prior to procedure a time out was called to verify the correct patient, procedure, equipment, support staff and site/side marked as required. Patient was prepped and draped in the usual sterile fashion.      Clinical Data: No additional findings.   Subjective: Chief Complaint  Patient presents with   Left Knee - Pain    Durolane injection left knee    HPI patient returns for ongoing problems with her left knee she is used Tylenol without relief and she is here for a Durolane injection left knee.  Previous cortisone injections were done by [name], PA-C.  Review of Systems updated unchanged.   Objective: Vital Signs: Ht 5\' 5"  (1.651 m)   Wt (!) 306 lb (138.8 kg)   BMI 50.92 kg/m   Physical Exam Constitutional:      Appearance: She is well-developed.  HENT:     Head: Normocephalic.     Right Ear: External ear normal.     Left Ear: External ear normal. There  is no impacted cerumen.  Eyes:     Pupils: Pupils are equal, round, and reactive to light.  Neck:     Thyroid: No thyromegaly.     Trachea: No tracheal deviation.  Cardiovascular:     Rate and Rhythm: Normal rate.  Pulmonary:     Effort: Pulmonary effort is normal.  Abdominal:     Palpations: Abdomen is soft.  Musculoskeletal:     Cervical back: No rigidity.  Skin:    General: Skin is warm and dry.  Neurological:     Mental Status: She is alert and oriented to person, place, and time.  Psychiatric:        Behavior: Behavior normal.    Ortho Exam mild crepitus knee range of motion no significant effusion no rash over exposed skin negative logroll hips.  Specialty Comments:  No specialty comments available.  Imaging: No results found.   PMFS History: Patient Active Problem List   Diagnosis Date Noted   Arthritis of left knee 08/14/2020   Headache 01/03/2009   Past Medical History:  Diagnosis Date   Fibromyalgia    Fibromyalgia    Hypertension    was taken off her meds she states by dr   Obesity     Family History  Problem Relation Age of Onset   Diabetes Mother  Hypertension Mother    Cirrhosis Mother    Cancer Mother        Liver   Diabetes Father    Hypertension Father    Parkinson's disease Father    Dementia Father    Kidney failure Sister    Diabetes Sister     Past Surgical History:  Procedure Laterality Date   CHOLECYSTECTOMY     HAND SURGERY     TONSILLECTOMY     TUBAL LIGATION     Social History   Occupational History   Not on file  Tobacco Use   Smoking status: Former    Packs/day: 0.25    Years: 10.00    Pack years: 2.50    Types: Cigarettes   Smokeless tobacco: Never  Vaping Use   Vaping Use: Never used  Substance and Sexual Activity   Alcohol use: Not Currently    Comment: occ   Drug use: Never   Sexual activity: Yes    Birth control/protection: Surgical    Comment: 1st intercourse 45 yo-More than 5 partners

## 2020-09-13 ENCOUNTER — Telehealth: Payer: Self-pay | Admitting: Orthopaedic Surgery

## 2020-09-13 NOTE — Telephone Encounter (Signed)
Patient called the injection has worn off and her left knee is starting to hurt again. Patient asked what is her next plan of care? The number to contact patient [phone]

## 2020-09-18 ENCOUNTER — Encounter: Payer: Self-pay | Admitting: Surgery

## 2020-09-18 ENCOUNTER — Ambulatory Visit: Payer: Self-pay

## 2020-09-18 ENCOUNTER — Other Ambulatory Visit: Payer: Self-pay

## 2020-09-18 ENCOUNTER — Ambulatory Visit (INDEPENDENT_AMBULATORY_CARE_PROVIDER_SITE_OTHER): Payer: Managed Care, Other (non HMO) | Admitting: Surgery

## 2020-09-18 VITALS — BP 139/83 | Ht 65.0 in | Wt 307.6 lb

## 2020-09-18 DIAGNOSIS — G8929 Other chronic pain: Secondary | ICD-10-CM

## 2020-09-18 DIAGNOSIS — M25562 Pain in left knee: Secondary | ICD-10-CM

## 2020-09-18 DIAGNOSIS — M2550 Pain in unspecified joint: Secondary | ICD-10-CM

## 2020-09-18 DIAGNOSIS — M79672 Pain in left foot: Secondary | ICD-10-CM

## 2020-09-18 NOTE — Progress Notes (Signed)
Office Visit Note   Patient: Nichole Stone           Date of Birth: Jun 08, 1975           MRN: [id number] Visit Date: 09/18/2020              Requested by: No referring provider defined for this encounter. PCP: Pcp, No   Assessment & Plan: Visit Diagnoses:  1. Pain in left foot   2. Chronic pain of left knee   3. Polyarthralgia     Plan: Advised patient we are somewhat limited as to what we can do regards to her left knee because she really needs an updated MRI.  Scan that I ordered was previously denied due to patient not having formal PT.  I put in another order for formal PT today and stressed the patient the importance of starting.  States that she will make arrangements to go.  In regards to acute left great toe pain advised patient that this may be gout.  Had blood work drawn to check a CBC and arthritis panel.  She can use Voltaren gel over the affected area.  May consider injection of the first MTP joint in the future.  Patient declined to have this today.  Follow-Up Instructions: Return in about 5 weeks (around 10/23/2020) for with dr yates recheck left knee. .   Orders:  Orders Placed This Encounter  Procedures   XR Foot Complete Left   CBC   Sed Rate (ESR)   Antinuclear Antib (ANA)   Rheumatoid Factor   Uric acid   Ambulatory referral to Physical Therapy   No orders of the defined types were placed in this encounter.     Procedures: No procedures performed   Clinical Data: No additional findings.   Subjective: Chief Complaint  Patient presents with   Left Knee - Pain   Left Foot - Pain    HPI  Review of Systems   Objective: Vital Signs: BP 139/83   Ht 5' 5"  (1.651 m)   Wt (!) 307 lb 9.6 oz (139.5 kg)   BMI 51.19 kg/m   Physical Exam  Ortho Exam  Specialty Comments:  No specialty comments available.  Imaging: No results found.   PMFS History: Patient Active Problem List   Diagnosis Date Noted   Arthritis of left  knee 08/14/2020   Headache 01/03/2009   Past Medical History:  Diagnosis Date   Fibromyalgia    Fibromyalgia    Hypertension    was taken off her meds she states by dr   Obesity     Family History  Problem Relation Age of Onset   Diabetes Mother    Hypertension Mother    Cirrhosis Mother    Cancer Mother        Liver   Diabetes Father    Hypertension Father    Parkinson's disease Father    Dementia Father    Kidney failure Sister    Diabetes Sister     Past Surgical History:  Procedure Laterality Date   CHOLECYSTECTOMY     HAND SURGERY     TONSILLECTOMY     TUBAL LIGATION     Social History   Occupational History   Not on file  Tobacco Use   Smoking status: Former    Packs/day: 0.25    Years: 10.00    Pack years: 2.50    Types: Cigarettes   Smokeless tobacco: Never  Vaping Use  Vaping Use: Never used  Substance and Sexual Activity   Alcohol use: Not Currently    Comment: occ   Drug use: Never   Sexual activity: Yes    Birth control/protection: Surgical    Comment: 1st intercourse 45 yo-More than 5 partners

## 2020-09-18 NOTE — Telephone Encounter (Signed)
Patient is being seen in the office today with Fayrene Fearing.

## 2020-09-20 ENCOUNTER — Other Ambulatory Visit: Payer: Self-pay

## 2020-09-20 DIAGNOSIS — I728 Aneurysm of other specified arteries: Secondary | ICD-10-CM

## 2020-09-23 ENCOUNTER — Other Ambulatory Visit: Payer: Self-pay

## 2020-09-23 ENCOUNTER — Telehealth: Payer: Self-pay | Admitting: Orthopaedic Surgery

## 2020-09-23 LAB — CBC
HCT: 39.9 % (ref 35.0–45.0)
Hemoglobin: 13.2 g/dL (ref 11.7–15.5)
MCH: 27.6 pg (ref 27.0–33.0)
MCHC: 33.1 g/dL (ref 32.0–36.0)
MCV: 83.3 fL (ref 80.0–100.0)
MPV: 10.7 fL (ref 7.5–12.5)
Platelets: 424 10*3/uL — ABNORMAL HIGH (ref 140–400)
RBC: 4.79 10*6/uL (ref 3.80–5.10)
RDW: 13.9 % (ref 11.0–15.0)
WBC: 11.3 10*3/uL — ABNORMAL HIGH (ref 3.8–10.8)

## 2020-09-23 LAB — ANA: Anti Nuclear Antibody (ANA): NEGATIVE

## 2020-09-23 LAB — URIC ACID: Uric Acid, Serum: 2.8 mg/dL (ref 2.5–7.0)

## 2020-09-23 LAB — RHEUMATOID FACTOR: Rheumatoid fact SerPl-aCnc: <14 IU/mL (ref <14)

## 2020-09-23 LAB — SEDIMENTATION RATE: Sed Rate: 51 mm/h — ABNORMAL HIGH (ref 0–20)

## 2020-09-23 NOTE — Telephone Encounter (Signed)
Pt called about blood work [phone].he had done with PA Barry Dienes. Please call pt at

## 2020-09-23 NOTE — Telephone Encounter (Signed)
Can you please advise on labwork? Patient saw Fayrene Fearing with continued left knee pain. Results are in the chart. Gel injection only lasted x a couple of weeks.

## 2020-10-03 ENCOUNTER — Ambulatory Visit: Payer: Managed Care, Other (non HMO) | Admitting: Physical Therapy

## 2020-10-10 ENCOUNTER — Ambulatory Visit
Admission: RE | Admit: 2020-10-10 | Discharge: 2020-10-10 | Disposition: A | Discharge status: Home / Self Care | Payer: Managed Care, Other (non HMO) | Source: Ambulatory Visit | Attending: Vascular Surgery | Admitting: Vascular Surgery

## 2020-10-10 ENCOUNTER — Other Ambulatory Visit: Payer: Self-pay

## 2020-10-10 DIAGNOSIS — I728 Aneurysm of other specified arteries: Secondary | ICD-10-CM

## 2020-10-10 MED ORDER — IOPAMIDOL (ISOVUE-370) INJECTION 76%
100.0000 mL | Freq: Once | INTRAVENOUS | Status: AC | PRN
  Administered 2020-10-10: 100 mL via INTRAVENOUS

## 2020-10-17 ENCOUNTER — Encounter: Payer: Self-pay | Admitting: Rehabilitative and Restorative Service Providers"

## 2020-10-17 ENCOUNTER — Ambulatory Visit (INDEPENDENT_AMBULATORY_CARE_PROVIDER_SITE_OTHER): Payer: Managed Care, Other (non HMO) | Admitting: Rehabilitative and Restorative Service Providers"

## 2020-10-17 ENCOUNTER — Telehealth: Payer: Self-pay | Admitting: Surgery

## 2020-10-17 ENCOUNTER — Other Ambulatory Visit: Payer: Self-pay

## 2020-10-17 DIAGNOSIS — G8929 Other chronic pain: Secondary | ICD-10-CM

## 2020-10-17 DIAGNOSIS — M25662 Stiffness of left knee, not elsewhere classified: Secondary | ICD-10-CM | POA: Diagnosis not present

## 2020-10-17 DIAGNOSIS — R600 Localized edema: Secondary | ICD-10-CM

## 2020-10-17 DIAGNOSIS — R262 Difficulty in walking, not elsewhere classified: Secondary | ICD-10-CM

## 2020-10-17 DIAGNOSIS — M25562 Pain in left knee: Secondary | ICD-10-CM

## 2020-10-17 NOTE — Patient Instructions (Signed)
Access Code: [email or web address] URL: [email or web address] Date: 10/17/2020 Prepared by: Pauletta Browns  Exercises Supine Quadricep Sets - 2-3 x daily - 7 x weekly - 2-3 sets - 10 reps - 5 second hold

## 2020-10-17 NOTE — Therapy (Signed)
[facility] [address] [place], [place], [address] Phone: [phone]   Fax:  [phone]  Physical Therapy Evaluation  Patient Details  Name: Nichole Stone MRN: [id number] Date of Birth: 20-Sep-1975 Referring Provider (PT): Naida Sleight PA-C   Encounter Date: 10/17/2020   PT End of Session - 10/17/20 1705     Visit Number 1    Number of Visits 12    Date for PT Re-Evaluation 11/28/20    Progress Note Due on Visit 12    PT Start Time 1430    PT Stop Time 1515    PT Time Calculation (min) 45 min    Activity Tolerance No increased pain;Patient tolerated treatment well    Behavior During Therapy WFL for tasks assessed/performed             Past Medical History:  Diagnosis Date   Fibromyalgia    Fibromyalgia    Hypertension    was taken off her meds she states by dr   Obesity     Past Surgical History:  Procedure Laterality Date   CHOLECYSTECTOMY     HAND SURGERY     TONSILLECTOMY     TUBAL LIGATION      There were no vitals filed for this visit.    Subjective Assessment - 10/17/20 1656     Subjective Anayia has had L > R knee pain for > a year.  She first noted knee pain when lifting a TV at work.  Degenerative changes are noted on last year's MRI.    Pertinent History Fibromyalgia, HTN, obesity    Limitations House hold activities;Lifting;Standing;Walking    How long can you sit comfortably? Start-up pain and stiffness    How long can you stand comfortably? Depends on the day    How long can you walk comfortably? No longer walks for exercise due to L knee pain    Patient Stated Goals Return to walking for exercise, lose weight, decrease L knee and foot pain    Currently in Pain? Yes    Pain Score 5     Pain Location Knee    Pain Orientation Left    Pain Descriptors / Indicators Tightness;Sore;Aching    Pain Type Chronic pain    Pain Radiating Towards NA    Pain Onset More than a month ago    Pain  Frequency Constant    Aggravating Factors  WB and prolonged postures    Pain Relieving Factors Gentle movement    Effect of Pain on Daily Activities Has to sit most of the day at work (should be standing nearly all day)    Multiple Pain Sites No                [facility] PT Assessment - 10/17/20 0001       Assessment   Medical Diagnosis L knee and foot pain    Referring Provider (PT) Naida Sleight PA-C    Onset Date/Surgical Date --   Over a year     Precautions   Precautions None    Precaution Comments May need cues to avoid hyperextension and locking knees when standing      Restrictions   Weight Bearing Restrictions No      Balance Screen   Has the patient fallen in the past 6 months No    Has the patient had a decrease in activity level because of a fear of falling?  No    Is the  patient reluctant to leave their home because of a fear of falling?  No      Home [employer]ist information centre manager residence    Additional Comments 4 steps (has trouble descending)      Prior Function   Level of Independence Independent    Vocation Full time employment    Vocation Requirements Normally standing all day, working modified duty now    Leisure Walking      Cognition   Overall Cognitive Status Within Functional Limits for tasks assessed      Observation/Other Assessments   Focus on Therapeutic Outcomes (FOTO)  43 (Goal 52 by visit 12)      Observation/Other Assessments-Edema    Edema --   Noted but not measured     ROM / Strength   AROM / PROM / Strength AROM;Strength      AROM   Overall AROM  Deficits    Overall AROM Comments Hyperextension noted but not measured (significant)    AROM Assessment Site Knee    Right/Left Knee Left;Right    Right Knee Extension 0    Right Knee Flexion 126    Left Knee Extension 0    Left Knee Flexion 110      Strength   Overall Strength Deficits    Strength Assessment Site Knee    Right/Left Knee Left;Right    Right Knee  Extension --   59.8 pounds   Left Knee Extension --   21.2 pounds                       Objective measurements completed on examination: See above findings.       [facility] Adult PT Treatment/Exercise - 10/17/20 0001       Self-Care   Self-Care Other Self-Care Comments    Other Self-Care Comments  Reviewed knee anatomy, imaging, discussed weight-loss benefits and how exercises will help with her knee pain and future degenerative changes      Exercises   Exercises Knee/Hip      Knee/Hip Exercises: Supine   Quad Sets Strengthening;Both;2 sets;10 reps;Limitations    Quad Sets Limitations Toes back, press knees down and tighten thighs (pillow under knees to avoid hyperextension)                     PT Education - 10/17/20 1700     Education Details Spoke at length about the role of physical therapy in improving quadriceps strength to help decompress the knee joint.  Reviewed anatomy and previous imaging.  Spoke about the importance of continued weight-loss in long-term joint health.  Started basic HEP with progressions next visit.    Person(s) Educated Patient    Methods Explanation;Demonstration;Tactile cues;Verbal cues;Handout    Comprehension Verbal cues required;Need further instruction;Returned demonstration;Verbalized understanding;Tactile cues required              PT Short Term Goals - 10/17/20 1701       PT SHORT TERM GOAL #1   Title Shawna will be able to self-correct hyperextended knees in standing.    Baseline Limits comfort with standing.    Time 4    Period Weeks    Status New    Target Date 11/14/20      PT SHORT TERM GOAL #2   Title Improve L knee flexion AROM to 120 degrees.    Baseline 110 degrees    Time 4    Period Weeks  Status New    Target Date 11/14/20               PT Long Term Goals - 10/17/20 1702       PT LONG TERM GOAL #1   Title Improve FOTO score to 52.    Baseline 43    Time 6    Period Weeks     Status New    Target Date 11/28/20      PT LONG TERM GOAL #2   Title Domonique will report L knee and foot pain consistently 0-3/10 on the Numeric Pain Rating Scale.    Baseline Can be 5+/10    Time 6    Period Weeks    Status New    Target Date 11/28/20      PT LONG TERM GOAL #3   Title Improve B quadriceps strength to > 80 pounds.    Baseline See objective (~20 to 60 pounds)    Time 6    Period Weeks    Status New    Target Date 11/28/20      PT LONG TERM GOAL #4   Title Cassadi will be independent with her long-term HEP at [place].    Baseline Prescribed today    Time 6    Period Weeks    Status New    Target Date 11/28/20                    Plan - 10/17/20 1705     Clinical Impression Statement Anjana has had L knee pain for over a year.  MRI from a year ago shows degenerative changes.  Her L foot has also started bothering her likely due to changes in gait.  Elica had some obvious L knee edema today and she tends to lock her knees out when standing.  Quadriceps strength is poor (~20 pounds L, normal for her would be 150+ pounds).  Naila is a good PT candidate.  With consistent HEP compliance, attendance in PT, the expected weight-loss with her diet and exercise, Maddisen will get stronger, move better and may be able to delay or avoid additional medical interventions with her L knee at this time.    Personal Factors and Comorbidities Fitness;Time since onset of injury/illness/exacerbation    Examination-Activity Limitations Transfers;Bend;Lift;Squat;Locomotion Level;Stairs;Carry;Stand    Examination-Participation Restrictions Occupation;Community Activity    Stability/Clinical Decision Making Stable/Uncomplicated    Clinical Decision Making Low    Rehab Potential Good    PT Frequency 2x / week    PT Duration 6 weeks    PT Treatment/Interventions ADLs/Self Care Home Management;Electrical Stimulation;Cryotherapy;Therapeutic activities;Stair training;Therapeutic exercise;Balance  training;Neuromuscular re-education;Patient/family education;Manual techniques;Vasopneumatic Device    PT Next Visit Plan Progress quadriceps strength (sit to stand, leg press, 90-30 degrees knee extensions, bike), balance, bike    PT Home Exercise Plan [email or web address]    Consulted and Agree with Plan of Care Patient             Patient will benefit from skilled therapeutic intervention in order to improve the following deficits and impairments:  Abnormal gait, Decreased activity tolerance, Decreased endurance, Decreased range of motion, Decreased strength, Difficulty walking, Increased edema, Impaired flexibility, Obesity, Pain  Visit Diagnosis: Difficulty walking  Localized edema  Stiffness of left knee, not elsewhere classified  Chronic pain of left knee     Problem List Patient Active Problem List   Diagnosis Date Noted   Arthritis of left knee 08/14/2020   Headache 01/03/2009  Cherlyn Cushing, PT, MPT 10/17/2020, 5:11 PM  [facility] [address] [place], [place], [address] Phone: [phone]   Fax:  [phone]  Name: Siah Kannan MRN: [id number] Date of Birth: 01-22-1975

## 2020-10-17 NOTE — Telephone Encounter (Signed)
Patient request an updated work note that states she can seat if needed while working. She states Fayrene Fearing has written her one before but she need a new note.

## 2020-10-22 NOTE — Telephone Encounter (Signed)
Patient aware note at front desk  

## 2020-10-28 ENCOUNTER — Other Ambulatory Visit: Payer: Self-pay

## 2020-10-28 ENCOUNTER — Ambulatory Visit (INDEPENDENT_AMBULATORY_CARE_PROVIDER_SITE_OTHER): Payer: Managed Care, Other (non HMO) | Admitting: Physical Therapy

## 2020-10-28 DIAGNOSIS — M25562 Pain in left knee: Secondary | ICD-10-CM | POA: Diagnosis not present

## 2020-10-28 DIAGNOSIS — G8929 Other chronic pain: Secondary | ICD-10-CM

## 2020-10-28 DIAGNOSIS — R600 Localized edema: Secondary | ICD-10-CM | POA: Diagnosis not present

## 2020-10-28 DIAGNOSIS — R262 Difficulty in walking, not elsewhere classified: Secondary | ICD-10-CM

## 2020-10-28 DIAGNOSIS — M25662 Stiffness of left knee, not elsewhere classified: Secondary | ICD-10-CM | POA: Diagnosis not present

## 2020-10-28 NOTE — Therapy (Addendum)
[facility] [address] [place], [place], [address] Phone: [phone]   Fax:  [phone]  Physical Therapy Treatment/Discharge  PHYSICAL THERAPY DISCHARGE SUMMARY  Visits from Start of Care: 2  Current functional level related to goals / functional outcomes: See note   Remaining deficits: See note   Education / Equipment: HEP  Plan: Patient goals were not met. Patient is being discharged due to not returning since last visit.  Elsie Ra, PT, DPT 06/17/21 8:51 AM     Patient Details  Name: Nichole Stone MRN: [id number] Date of Birth: 1975-11-19 Referring Provider (PT): Lanae Crumbly PA-C   Encounter Date: 10/28/2020   PT End of Session - 10/28/20 0843     Visit Number 2    Number of Visits 12    Date for PT Re-Evaluation 11/28/20    Progress Note Due on Visit 12    PT Start Time 0810    PT Stop Time 0848    PT Time Calculation (min) 38 min    Activity Tolerance No increased pain;Patient tolerated treatment well    Behavior During Therapy WFL for tasks assessed/performed             Past Medical History:  Diagnosis Date   Fibromyalgia    Fibromyalgia    Hypertension    was taken off her meds she states by dr   Obesity     Past Surgical History:  Procedure Laterality Date   CHOLECYSTECTOMY     HAND SURGERY     TONSILLECTOMY     TUBAL LIGATION      There were no vitals filed for this visit.   Subjective Assessment - 10/28/20 0818     Subjective she relays Lt knee pain is about 2-3 out of 10 upon arrival.    Pertinent History Fibromyalgia, HTN, obesity    Limitations House hold activities;Lifting;Standing;Walking    How long can you sit comfortably? Start-up pain and stiffness    How long can you stand comfortably? Depends on the day    How long can you walk comfortably? No longer walks for exercise due to L knee pain    Patient Stated Goals Return to walking for exercise, lose weight,  decrease L knee and foot pain    Pain Onset More than a month ago                               [facility] Adult PT Treatment/Exercise - 10/28/20 0001       Knee/Hip Exercises: Stretches   Sports administrator Limitations supine quad stretch 20 sec X3 on Lt  20 sec x2 on Rt     Knee/Hip Exercises: Aerobic   Nustep L6 X 6 min      Knee/Hip Exercises: Machines for Strengthening   Total Gym Leg Press DL 75# 2X10, SL 43# X15 ea side      Knee/Hip Exercises: Seated   Long Arc Quad Both;20 reps    Long Arc Quad Weight 5 lbs.    Other Seated Knee/Hip Exercises seated SLR X15 bilat    Hamstring Curl Both;20 reps    Hamstring Limitations red      Knee/Hip Exercises: Supine   Hip Adduction Isometric Both;15 reps    Hip Adduction Isometric Limitations 5 sec    Bridges Both;15 reps    Bridges Limitations hold 5 sec    Straight Leg Raises Both;15 reps  Other Supine Knee/Hip Exercises clams green X20                       PT Short Term Goals - 10/17/20 1701       PT SHORT TERM GOAL #1   Title Zyanya will be able to self-correct hyperextended knees in standing.    Baseline Limits comfort with standing.    Time 4    Period Weeks    Status New    Target Date 11/14/20      PT SHORT TERM GOAL #2   Title Improve L knee flexion AROM to 120 degrees.    Baseline 110 degrees    Time 4    Period Weeks    Status New    Target Date 11/14/20               PT Long Term Goals - 10/17/20 1702       PT LONG TERM GOAL #1   Title Improve FOTO score to 52.    Baseline 43    Time 6    Period Weeks    Status New    Target Date 11/28/20      PT LONG TERM GOAL #2   Title Ayn will report L knee and foot pain consistently 0-3/10 on the Numeric Pain Rating Scale.    Baseline Can be 5+/10    Time 6    Period Weeks    Status New    Target Date 11/28/20      PT LONG TERM GOAL #3   Title Improve B quadriceps strength to > 80 pounds.    Baseline See objective  (~20 to 60 pounds)    Time 6    Period Weeks    Status New    Target Date 11/28/20      PT LONG TERM GOAL #4   Title Darlyn will be independent with her long-term HEP at [place].    Baseline Prescribed today    Time 6    Period Weeks    Status New    Target Date 11/28/20                   Plan - 10/28/20 0845     Clinical Impression Statement sesson focused on bilat quadriceps strengthening to her tolerance with cues to avoid going into hyperextension. She has decreased flexability for Lt knee flexion so I also added quad stretch for this. She had fair to good overall exercise tolerance today, and we will try to progress strength program to her tolrerance level.    Personal Factors and Comorbidities Fitness;Time since onset of injury/illness/exacerbation    Examination-Activity Limitations Transfers;Bend;Lift;Squat;Locomotion Level;Stairs;Carry;Stand    Examination-Participation Restrictions Occupation;Community Activity    Stability/Clinical Decision Making Stable/Uncomplicated    Rehab Potential Good    PT Frequency 2x / week    PT Duration 6 weeks    PT Treatment/Interventions ADLs/Self Care Home Management;Electrical Stimulation;Cryotherapy;Therapeutic activities;Stair training;Therapeutic exercise;Balance training;Neuromuscular re-education;Patient/family education;Manual techniques;Vasopneumatic Device    PT Next Visit Plan Progress quadriceps strength (sit to stand, leg press, 90-30 degrees knee extensions, bike), balance, bike    PT Home Exercise Plan [email or web address]    Consulted and Agree with Plan of Care Patient             Patient will benefit from skilled therapeutic intervention in order to improve the following deficits and impairments:  Abnormal gait, Decreased activity tolerance, Decreased endurance, Decreased range of motion, Decreased strength,  Difficulty walking, Increased edema, Impaired flexibility, Obesity, Pain  Visit Diagnosis: Difficulty  walking  Localized edema  Stiffness of left knee, not elsewhere classified  Chronic pain of left knee     Problem List Patient Active Problem List   Diagnosis Date Noted   Arthritis of left knee 08/14/2020   Headache 01/03/2009    Debbe Odea, PT,DPT 10/28/2020, 8:54 AM  [facility] [address] [place], [place], [address] Phone: [phone]   Fax:  [phone]  Name: Nichole Stone MRN: [id number] Date of Birth: [birth year]

## 2020-10-30 ENCOUNTER — Ambulatory Visit: Payer: Managed Care, Other (non HMO) | Admitting: Vascular Surgery

## 2020-10-30 ENCOUNTER — Ambulatory Visit: Payer: Managed Care, Other (non HMO) | Admitting: Orthopaedic Surgery

## 2020-11-01 ENCOUNTER — Encounter: Payer: Managed Care, Other (non HMO) | Admitting: Rehabilitative and Restorative Service Providers"

## 2020-11-01 ENCOUNTER — Telehealth: Payer: Self-pay | Admitting: Rehabilitative and Restorative Service Providers"

## 2020-11-01 ENCOUNTER — Ambulatory Visit: Payer: Managed Care, Other (non HMO) | Admitting: Orthopaedic Surgery

## 2020-11-01 NOTE — Telephone Encounter (Signed)
Left message reminding her of her Monday 8 AM appointment with Arlys John.  Left [phone] number to call and cancel if unable to attend.

## 2020-11-04 ENCOUNTER — Encounter: Payer: Managed Care, Other (non HMO) | Admitting: Physical Therapy

## 2020-11-04 ENCOUNTER — Telehealth: Payer: Self-pay | Admitting: Physical Therapy

## 2020-11-04 NOTE — Telephone Encounter (Signed)
Pt no show for PT appointment today. They were contacted and informed of this via voicemail. They were provided the date and time of their next appointment on voicemail. They were instructed to call us to let us know if they cannot make their appointment.   Ivery Quale, PT, DPT 11/04/20 8:27 AM

## 2020-11-06 ENCOUNTER — Telehealth: Payer: Self-pay | Admitting: Rehabilitative and Restorative Service Providers"

## 2020-11-06 NOTE — Telephone Encounter (Signed)
Left message reminding her of her 8 AM appointment tomorrow.  Left [phone] number to please call and cancel if unable to attend.

## 2020-11-07 ENCOUNTER — Encounter: Payer: Managed Care, Other (non HMO) | Admitting: Rehabilitative and Restorative Service Providers"

## 2020-11-07 ENCOUNTER — Telehealth: Payer: Self-pay | Admitting: Rehabilitative and Restorative Service Providers"

## 2020-11-07 NOTE — Telephone Encounter (Signed)
Left message notifying her of cancelling future appointments and she can call to reschedule.

## 2020-11-11 ENCOUNTER — Encounter: Payer: Managed Care, Other (non HMO) | Admitting: Physical Therapy

## 2020-11-15 ENCOUNTER — Encounter: Payer: Managed Care, Other (non HMO) | Admitting: Rehabilitative and Restorative Service Providers"

## 2020-11-18 ENCOUNTER — Encounter: Payer: Managed Care, Other (non HMO) | Admitting: Physical Therapy

## 2020-11-22 ENCOUNTER — Encounter: Payer: Managed Care, Other (non HMO) | Admitting: Rehabilitative and Restorative Service Providers"

## 2020-11-25 ENCOUNTER — Encounter: Payer: Managed Care, Other (non HMO) | Admitting: Physical Therapy

## 2020-11-29 ENCOUNTER — Encounter: Payer: Managed Care, Other (non HMO) | Admitting: Rehabilitative and Restorative Service Providers"

## 2020-12-02 ENCOUNTER — Encounter: Payer: Managed Care, Other (non HMO) | Admitting: Physical Therapy

## 2020-12-04 ENCOUNTER — Encounter: Payer: Managed Care, Other (non HMO) | Admitting: Physical Therapy

## 2021-01-13 ENCOUNTER — Encounter (HOSPITAL_BASED_OUTPATIENT_CLINIC_OR_DEPARTMENT_OTHER): Payer: Self-pay

## 2021-01-13 ENCOUNTER — Emergency Department (HOSPITAL_BASED_OUTPATIENT_CLINIC_OR_DEPARTMENT_OTHER)
Admission: EM | Admit: 2021-01-13 | Discharge: 2021-01-13 | Disposition: A | Discharge status: Home / Self Care | Payer: Managed Care, Other (non HMO) | Attending: Emergency Medicine | Admitting: Emergency Medicine

## 2021-01-13 ENCOUNTER — Other Ambulatory Visit: Payer: Self-pay

## 2021-01-13 DIAGNOSIS — I10 Essential (primary) hypertension: Secondary | ICD-10-CM | POA: Insufficient documentation

## 2021-01-13 DIAGNOSIS — E119 Type 2 diabetes mellitus without complications: Secondary | ICD-10-CM | POA: Diagnosis not present

## 2021-01-13 DIAGNOSIS — J069 Acute upper respiratory infection, unspecified: Secondary | ICD-10-CM | POA: Diagnosis not present

## 2021-01-13 DIAGNOSIS — Z20822 Contact with and (suspected) exposure to covid-19: Secondary | ICD-10-CM | POA: Diagnosis not present

## 2021-01-13 DIAGNOSIS — R059 Cough, unspecified: Secondary | ICD-10-CM | POA: Diagnosis present

## 2021-01-13 LAB — RESP PANEL BY RT-PCR (FLU A&B, COVID) ARPGX2
Influenza A by PCR: NEGATIVE
Influenza B by PCR: NEGATIVE
SARS Coronavirus 2 by RT PCR: NEGATIVE

## 2021-01-13 NOTE — Discharge Instructions (Addendum)
You were seen in the emergency department today for flu like symptoms.  As we discussed, you tested negative for COVID and flu. I think your symptoms are likely related to a viral respiratory infection, which are very common at this time of year. We normally treat with over the counter medications like decongestants (with lots of water), chloraseptic spray or lozenges for your throat, and ibuprofen/tylenol for fever or pain.  Please use Tylenol or ibuprofen for pain.  You may use 600 mg ibuprofen every 6 hours or 1000 mg of Tylenol every 6 hours.  You may choose to alternate between the 2.  This would be most effective.  Do not exceed 4 g of Tylenol within 24 hours.  Do not exceed 3200 mg ibuprofen within 24 hours.   Continue to monitor how you're doing and return to the ER for new or worsening symptoms such as fever despite medication, pain not related to breathing, or difficulty breathing.   It has been a pleasure seeing and caring for you today and I hope you start feeling better soon!

## 2021-01-13 NOTE — ED Triage Notes (Signed)
Pt c/o flu like sx x 3 days-NAD-steady gait ?

## 2021-01-13 NOTE — ED Provider Notes (Signed)
[facility] EMERGENCY DEPARTMENT Provider Note   CSN: [id number] Arrival date & time: 01/13/21  1238     History  Chief Complaint  Patient presents with   Cough    Nichole Stone is a 46 y.o. female with history of HTN and diabetes mellitus who presents the emergency department for flulike symptoms for 3 days.  Patient is complaining of intermittent nonproductive cough, sore throat, chest pain with breathing, nasal congestion.  She states she had a low-grade fever at home, highest recorded temperature 100 F.  She has been trying Tylenol every 4 hours with minimal relief.  She states she is currently on medication for weight loss, and this often gives her diarrhea, but no change recently.   Cough Associated symptoms: chest pain, chills, headaches, myalgias and sore throat   Associated symptoms: no fever and no shortness of breath       Home Medications Prior to Admission medications   Not on File      Allergies    Lisinopril    Review of Systems   Review of Systems  Constitutional:  Positive for chills. Negative for appetite change and fever.  HENT:  Positive for congestion and sore throat. Negative for trouble swallowing.   Respiratory:  Positive for cough. Negative for shortness of breath.   Cardiovascular:  Positive for chest pain.  Gastrointestinal:  Negative for abdominal pain, constipation, diarrhea, nausea and vomiting.  Genitourinary:  Negative for dysuria and urgency.  Musculoskeletal:  Positive for myalgias.  Neurological:  Positive for headaches. Negative for dizziness, syncope and light-headedness.  All other systems reviewed and are negative.  Physical Exam Updated Vital Signs BP (!) 155/94 (BP Location: Left Arm)    Pulse 73    Temp 98.4 F (36.9 C) (Oral)    Resp 18    Ht 5\' 5"  (1.651 m)    Wt 129.3 kg    LMP 12/30/2020    SpO2 98%    BMI 47.43 kg/m  Physical Exam Vitals and nursing note reviewed.  Constitutional:       Appearance: Normal appearance.  HENT:     Head: Normocephalic and atraumatic.  Eyes:     Conjunctiva/sclera: Conjunctivae normal.  Cardiovascular:     Rate and Rhythm: Normal rate and regular rhythm.  Pulmonary:     Effort: Pulmonary effort is normal. No respiratory distress.     Breath sounds: Normal breath sounds.  Abdominal:     General: There is no distension.     Palpations: Abdomen is soft.     Tenderness: There is no abdominal tenderness.  Skin:    General: Skin is warm and dry.  Neurological:     General: No focal deficit present.     Mental Status: She is alert.    ED Results / Procedures / Treatments   Labs (all labs ordered are listed, but only abnormal results are displayed) Labs Reviewed  RESP PANEL BY RT-PCR (FLU A&B, COVID) [id number]    EKG None  Radiology No results found.  Procedures Procedures    Medications Ordered in ED Medications - No data to display  ED Course/ Medical Decision Making/ A&P  Patient is a 46 y/o female presents the emergency department with complaint of flulike symptoms x3 days.  This patient presents to the ED for primary concern of cough, this involves an extensive number of treatment options, and is a complaint that carries with it a high risk of complications and morbidity. The differential diagnosis  for emergent cause of cough includes but is not limited to upper respiratory infection, lower respiratory infection, allergies, asthma, irritants, foreign body, medications such as ACE inhibitors, reflux, asthma, CHF, lung cancer, interstitial lung disease, psychiatric causes, postnasal drip and postinfectious bronchospasm.    Co morbidities that complicate the patient evaluation: Hypertension, diabetes mellitus  On exam patient is afebrile, not tachycardic, not hypoxic, no acute distress.  Her lungs are clear to auscultation in all fields.  Oropharynx with mild erythema, no edema, tonsillar swelling, or exudate.    Lab Tests: I  Ordered, and personally interpreted labs.  The pertinent results include:  Negative COVID and flu testing   Medicines ordered and prescription drug management:  I have reviewed the patients home medicines and have made adjustments as needed.  Dispostion: After consideration of the diagnostic results and the patients response to treatment, I feel that the patent would benefit from outpatient symptomatic management of likely viral upper respiratory infection with cough.  I have low suspicion for other acute etiologies for the patient's symptoms including infection, allergies, malignancy, pulmonary embolism, etc. Patient is PERC negative for PE.  She is hemodynamically stable and not requiring admission or inpatient treatment for her symptoms.  Will discharge to home, give close return precautions.  Patient plans to follow-up with her primary care doctor tomorrow.  Patient agreeable to plan.   Final Clinical Impression(s) / ED Diagnoses Final diagnoses:  Viral URI with cough    Rx / DC Orders ED Discharge Orders     None      Portions of this report may have been transcribed using voice recognition software. Every effort was made to ensure accuracy; however, inadvertent computerized transcription errors may be present.    Jeanella Flattery 01/13/21 1457    Cathren Laine, MD 01/15/21 1225

## 2021-02-25 IMAGING — DX DG ELBOW COMPLETE 3+V*R*
4 series · 4 of 4 positions shown · non-contrast
Comparison: None.

CLINICAL DATA: Pain, no injury

EXAM:
RIGHT ELBOW - COMPLETE 3+ VIEW

[elbow ap]
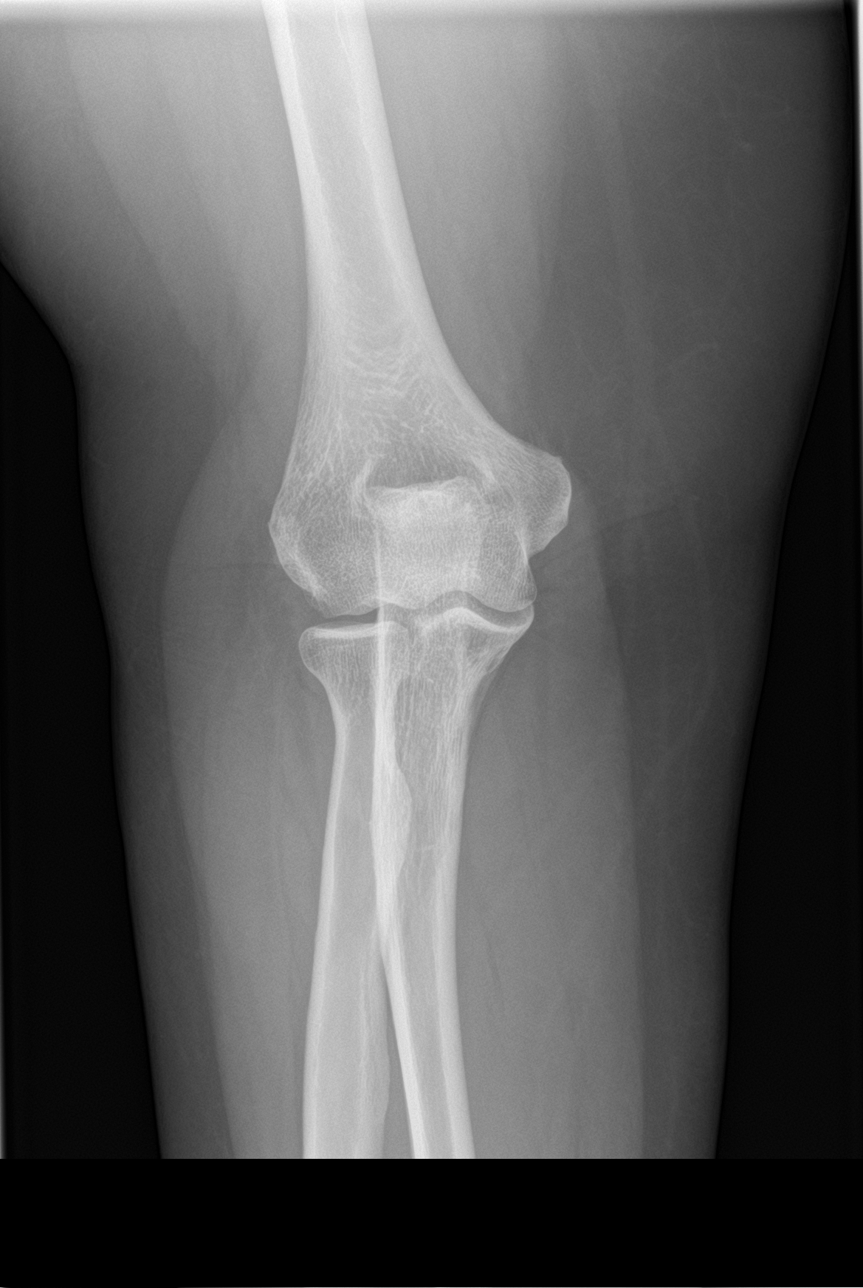

[elbow obl (1 of 2)]
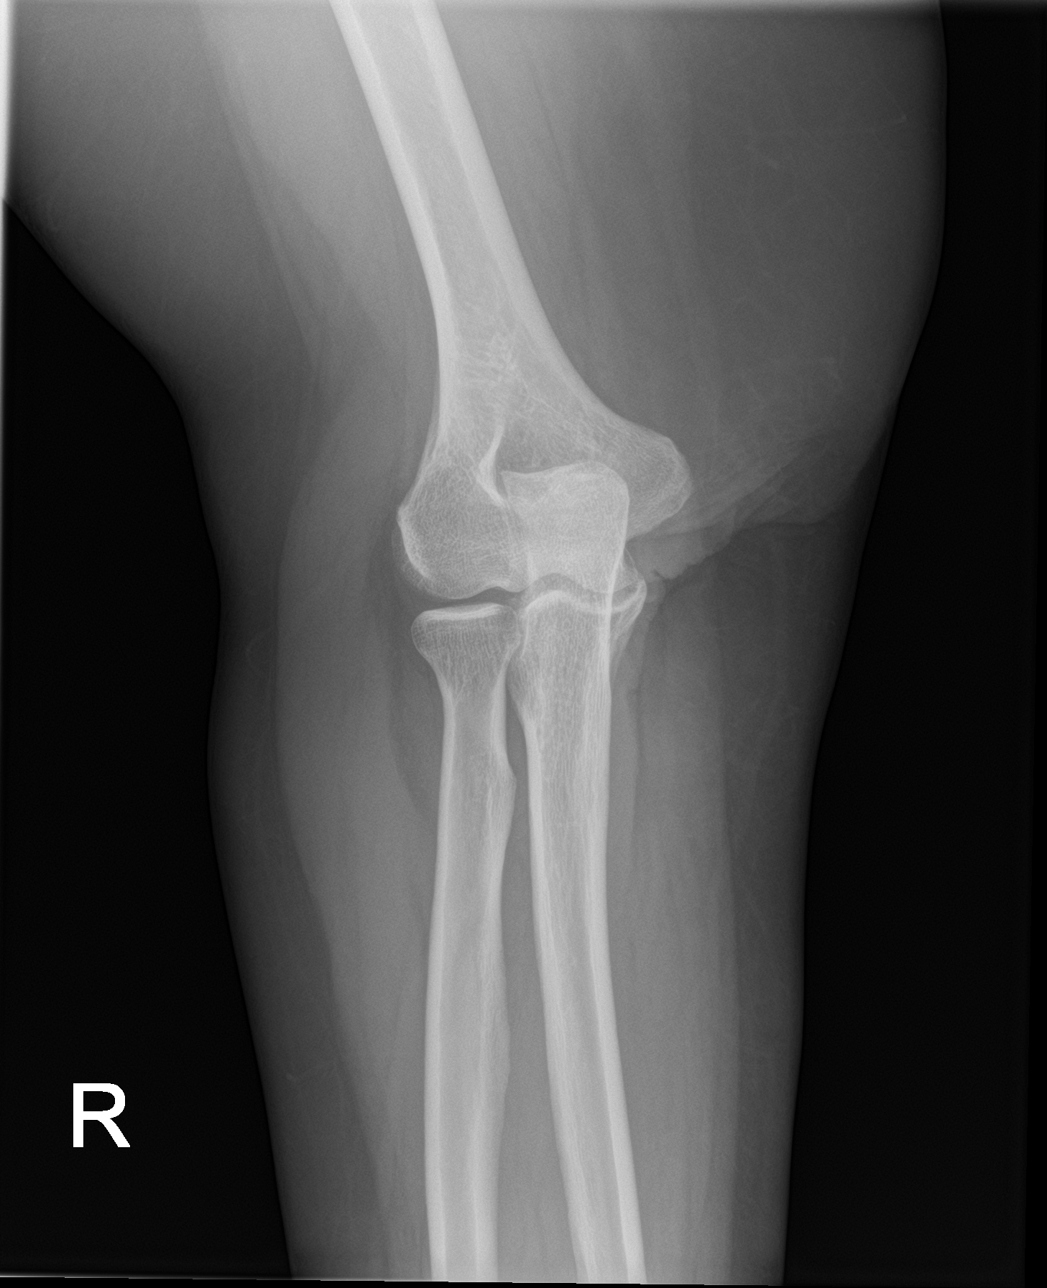

[elbow obl (2 of 2)]
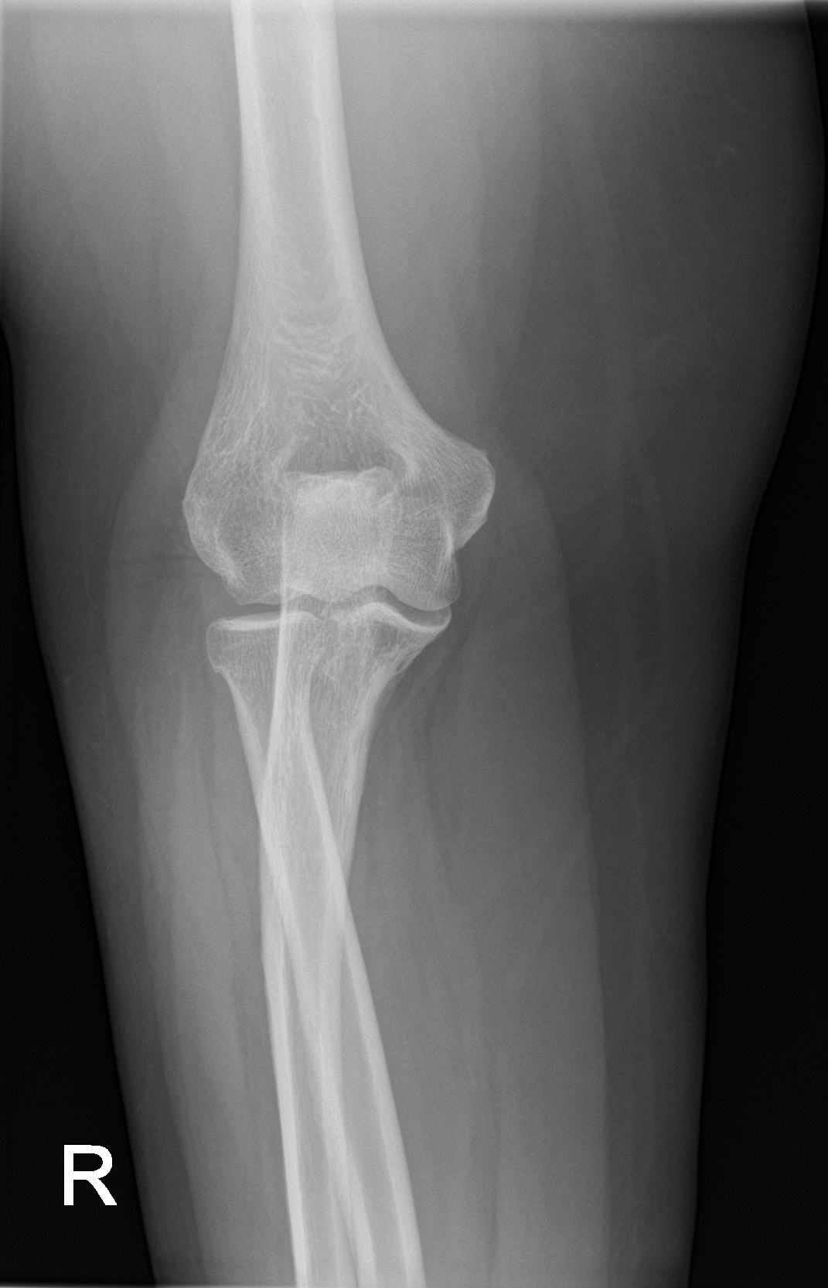

[elbow lat]
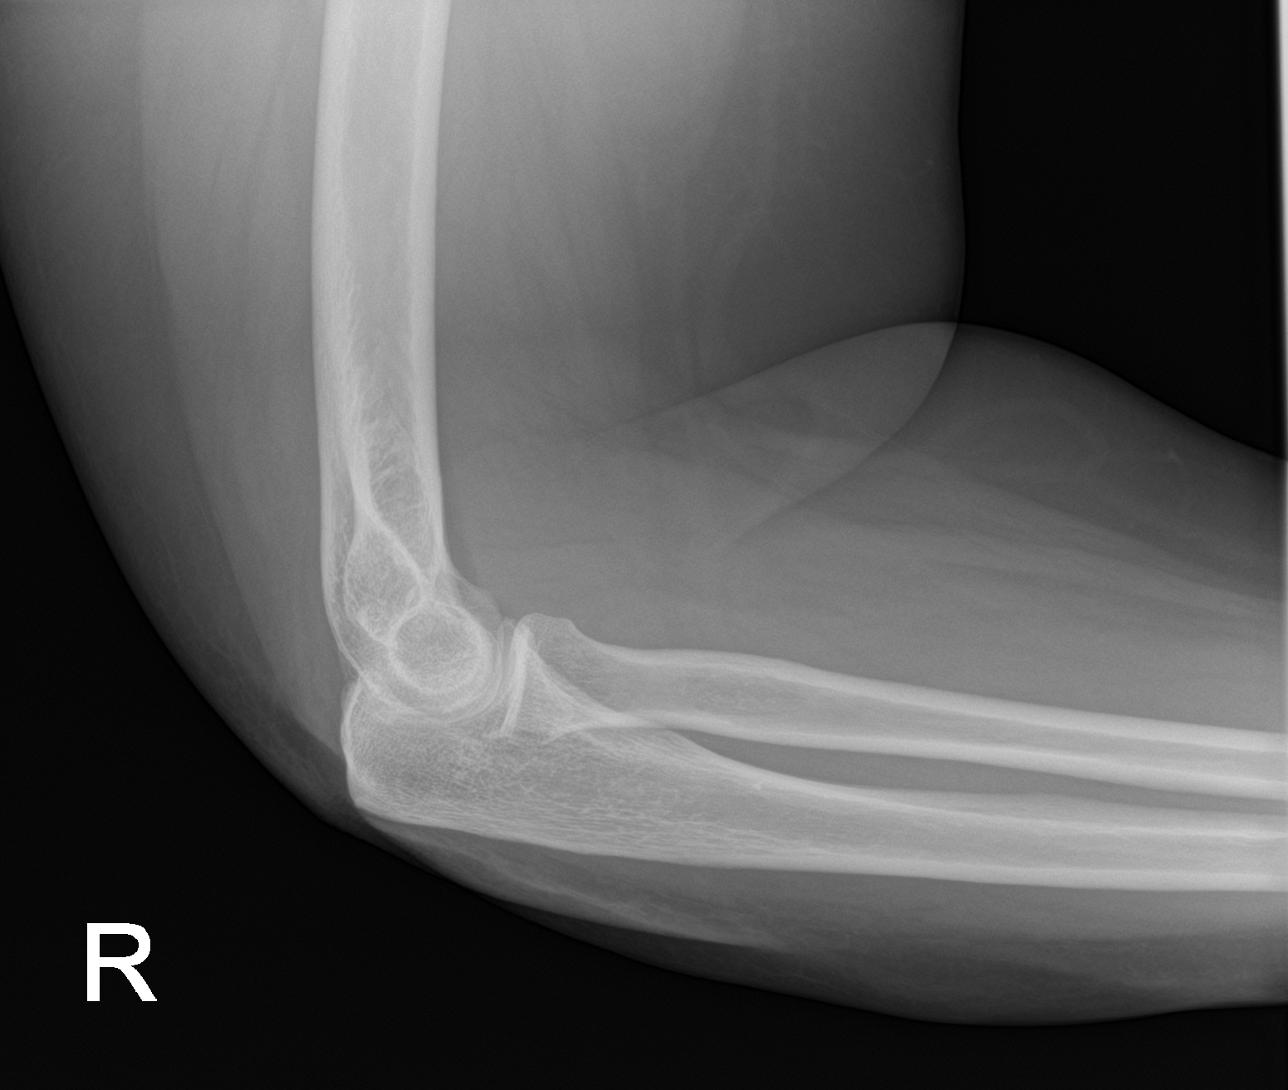

[4 of 4 positions shown; findings below may reference images not displayed]

FINDINGS: There is no evidence of fracture, dislocation, or joint effusion.
There is no evidence of arthropathy or other focal bone abnormality.
Soft tissues are unremarkable.
IMPRESSION: Negative.

## 2021-04-25 ENCOUNTER — Emergency Department (HOSPITAL_BASED_OUTPATIENT_CLINIC_OR_DEPARTMENT_OTHER): Payer: Managed Care, Other (non HMO)

## 2021-04-25 ENCOUNTER — Emergency Department (HOSPITAL_BASED_OUTPATIENT_CLINIC_OR_DEPARTMENT_OTHER)
Admission: EM | Admit: 2021-04-25 | Discharge: 2021-04-25 | Disposition: A | Discharge status: Home / Self Care | Payer: Managed Care, Other (non HMO) | Attending: Emergency Medicine | Admitting: Emergency Medicine

## 2021-04-25 ENCOUNTER — Other Ambulatory Visit: Payer: Self-pay

## 2021-04-25 ENCOUNTER — Encounter (HOSPITAL_BASED_OUTPATIENT_CLINIC_OR_DEPARTMENT_OTHER): Payer: Self-pay | Admitting: *Deleted

## 2021-04-25 DIAGNOSIS — X509XXA Other and unspecified overexertion or strenuous movements or postures, initial encounter: Secondary | ICD-10-CM | POA: Insufficient documentation

## 2021-04-25 DIAGNOSIS — S60222A Contusion of left hand, initial encounter: Secondary | ICD-10-CM | POA: Insufficient documentation

## 2021-04-25 DIAGNOSIS — S6992XA Unspecified injury of left wrist, hand and finger(s), initial encounter: Secondary | ICD-10-CM | POA: Diagnosis present

## 2021-04-25 NOTE — Discharge Instructions (Signed)
There does not appear to be any broken bones in your hand today you may have just ruptured a blood vessel which is what has caused the swelling and discomfort.  Ice the area and baby it for the next few days but would expect it to improve in 2 or 3 days. ?

## 2021-04-25 NOTE — ED Triage Notes (Signed)
Pt reports left hand pain and burning since last night. Denies known injury. States pain got worse when she pushed up on her hand last evening ?

## 2021-04-25 NOTE — ED Provider Notes (Signed)
?[facility] EMERGENCY DEPARTMENT ?Provider Note ? ? ?CSN: [id number] ?Arrival date & time: 04/25/21  1551 ? ?  ? ?History ? ?Chief Complaint  ?Patient presents with  ? Hand Pain  ? ? ?Nichole Stone is a 46 y.o. female. ? ?Patient is a 46 year old female presenting today with complaints of left hand pain.  She reports that he she was going to get up last night and she made a fist and put her weight on it when she felt a pop in her second and third MCP joints and since that time she has had discomfort and swelling.  She reports it is painful when she tries to carry things or when she holds her phone too long.  She denies any pain down into her fingers or any pain in her wrist.  No other known trauma. ? ?The history is provided by the patient.  ?Hand Pain ?This is a new problem. The current episode started yesterday.  ? ?  ? ?Home Medications ?Prior to Admission medications   ?Not on File  ?   ? ?Allergies    ?Lisinopril   ? ?Review of Systems   ?Review of Systems ? ?Physical Exam ?Updated Vital Signs ?BP (!) 172/119 (BP Location: Right Arm)   Pulse 80   Temp 98.2 ?F (36.8 ?C) (Oral)   Resp 18   LMP  (LMP Unknown)   SpO2 98%  ?Physical Exam ?Vitals and nursing note reviewed.  ?HENT:  ?   Head: Normocephalic.  ?Cardiovascular:  ?   Rate and Rhythm: Normal rate.  ?Musculoskeletal:     ?   General: Swelling and tenderness present.  ?   Left wrist: Normal.  ?     Hands: ? ?   Comments: Patient has swelling of her second and third MCP joint with tenderness with palpation.  No tenderness of her fingers or swelling of the fingers.  Full flexion extension of all 5 fingers.  ?Skin: ?   General: Skin is warm.  ?Neurological:  ?   Mental Status: She is alert.  ? ? ?ED Results / Procedures / Treatments   ?Labs ?(all labs ordered are listed, but only abnormal results are displayed) ?Labs Reviewed - No data to display ? ?EKG ?None ? ?Radiology ?DG Hand Complete Left ? ?Result Date: 04/25/2021 ?CLINICAL  DATA:  Left hand pain after injury 2 days ago. EXAM: LEFT HAND - COMPLETE 3+ VIEW COMPARISON:  None. FINDINGS: There is no evidence of fracture or dislocation. There is no evidence of arthropathy or other focal bone abnormality. Soft tissues are unremarkable. IMPRESSION: Negative. Electronically Signed   By: Lupita Raider M.D.   On: 04/25/2021 16:23   ? ?Procedures ?Procedures  ? ? ?Medications Ordered in ED ?Medications - No data to display ? ?ED Course/ Medical Decision Making/ A&P ?  ?                        ?Medical Decision Making ?Amount and/or Complexity of Data Reviewed ?Radiology: ordered and independent interpretation performed. Decision-making details documented in ED Course. ? ? ?Patient presenting today with pain in her second and third MCP joints since yesterday.  She tried to get up and felt a pop.  There is swelling there but I independently interpreted and visualized patient's x-ray it shows no evidence of fracture.  Suspect patient just has a hematoma and contusion causing her discomfort.  No evidence of infection.  Findings discussed with  the patient she was discharged home. ? ? ? ? ? ? ? ?Final Clinical Impression(s) / ED Diagnoses ?Final diagnoses:  ?Contusion of left hand, initial encounter  ? ? ?Rx / DC Orders ?ED Discharge Orders   ? ? None  ? ?  ? ? ?  ?Gwyneth Sprout, MD ?04/25/21 1651 ? ?

## 2021-07-23 ENCOUNTER — Encounter (HOSPITAL_BASED_OUTPATIENT_CLINIC_OR_DEPARTMENT_OTHER): Payer: Self-pay | Admitting: Emergency Medicine

## 2021-07-23 ENCOUNTER — Other Ambulatory Visit: Payer: Self-pay

## 2021-07-23 ENCOUNTER — Emergency Department (HOSPITAL_BASED_OUTPATIENT_CLINIC_OR_DEPARTMENT_OTHER)
Admission: EM | Admit: 2021-07-23 | Discharge: 2021-07-23 | Disposition: A | Discharge status: Home / Self Care | Payer: BC Managed Care – PPO | Attending: Emergency Medicine | Admitting: Emergency Medicine

## 2021-07-23 DIAGNOSIS — N760 Acute vaginitis: Secondary | ICD-10-CM | POA: Insufficient documentation

## 2021-07-23 DIAGNOSIS — B9689 Other specified bacterial agents as the cause of diseases classified elsewhere: Secondary | ICD-10-CM | POA: Insufficient documentation

## 2021-07-23 DIAGNOSIS — N938 Other specified abnormal uterine and vaginal bleeding: Secondary | ICD-10-CM | POA: Diagnosis present

## 2021-07-23 LAB — CBC WITH DIFFERENTIAL/PLATELET
Abs Immature Granulocytes: 0.04 10*3/uL (ref 0.00–0.07)
Basophils Absolute: 0.1 10*3/uL (ref 0.0–0.1)
Basophils Relative: 1 %
Eosinophils Absolute: 0.3 10*3/uL (ref 0.0–0.5)
Eosinophils Relative: 3 %
HCT: 40.7 % (ref 36.0–46.0)
Hemoglobin: 13.7 g/dL (ref 12.0–15.0)
Immature Granulocytes: 0 %
Lymphocytes Relative: 26 %
Lymphs Abs: 3.1 10*3/uL (ref 0.7–4.0)
MCH: 27.8 pg (ref 26.0–34.0)
MCHC: 33.7 g/dL (ref 30.0–36.0)
MCV: 82.7 fL (ref 80.0–100.0)
Monocytes Absolute: 0.6 10*3/uL (ref 0.1–1.0)
Monocytes Relative: 5 %
Neutro Abs: 7.8 10*3/uL — ABNORMAL HIGH (ref 1.7–7.7)
Neutrophils Relative %: 65 %
Platelets: 399 10*3/uL (ref 150–400)
RBC: 4.92 MIL/uL (ref 3.87–5.11)
RDW: 13.7 % (ref 11.5–15.5)
WBC: 11.9 10*3/uL — ABNORMAL HIGH (ref 4.0–10.5)
nRBC: 0 % (ref 0.0–0.2)

## 2021-07-23 LAB — BASIC METABOLIC PANEL
Anion gap: 7 (ref 5–15)
BUN: 13 mg/dL (ref 6–20)
CO2: 27 mmol/L (ref 22–32)
Calcium: 9.3 mg/dL (ref 8.9–10.3)
Chloride: 102 mmol/L (ref 98–111)
Creatinine, Ser: 0.7 mg/dL (ref 0.44–1.00)
GFR, Estimated: >60 mL/min (ref >60)
Glucose, Bld: 116 mg/dL — ABNORMAL HIGH (ref 70–99)
Potassium: 4.3 mmol/L (ref 3.5–5.1)
Sodium: 136 mmol/L (ref 135–145)

## 2021-07-23 LAB — WET PREP, GENITAL
Sperm: NONE SEEN
Trich, Wet Prep: NONE SEEN
WBC, Wet Prep HPF POC: <10 (ref <10)
Yeast Wet Prep HPF POC: NONE SEEN

## 2021-07-23 LAB — PREGNANCY, URINE: Preg Test, Ur: NEGATIVE

## 2021-07-23 MED ORDER — METRONIDAZOLE 500 MG PO TABS: 500.0000 mg | ORAL_TABLET | Freq: Two times a day (BID) | ORAL | 0 refills | Status: AC

## 2021-07-23 MED ORDER — METRONIDAZOLE 500 MG PO TABS: 500.0000 mg | ORAL_TABLET | Freq: Two times a day (BID) | ORAL | 0 refills | Status: DC

## 2021-07-23 MED ORDER — METRONIDAZOLE 375 MG PO CAPS: 375.0000 mg | ORAL_CAPSULE | Freq: Two times a day (BID) | ORAL | 0 refills | Status: DC

## 2021-07-23 NOTE — Discharge Instructions (Addendum)
Return if any problems.

## 2021-07-23 NOTE — ED Triage Notes (Addendum)
Pt reports vaginal bleeding since this am. Had a large clot when pain began and is having lower back pain. Last menstrual cycle was a few days ago and lasted normal 2-3 days for her and at normal time of month. Not sure if she is having a miscarriage.

## 2021-07-23 NOTE — ED Provider Notes (Signed)
[facility] EMERGENCY DEPARTMENT Provider Note   CSN: [id number] Arrival date & time: 07/23/21  1143     History  Chief Complaint  Patient presents with   Vaginal Bleeding    Nichole Stone is a 46 y.o. female.  Pt complains of vaginal bleeding.  Pt report she passed a large clot at home and is worried because this has never happened before.  Pt denies any std risk.  Pt had a normal papsmear a year ago.   The history is provided by the patient. No language interpreter was used.  Vaginal Bleeding Quality:  Dark red Severity:  Moderate Onset quality:  Sudden Duration:  1 day Timing:  Constant Progression:  Worsening Chronicity:  New Possible pregnancy: no   Relieved by:  Nothing Worsened by:  Nothing Ineffective treatments:  None tried Associated symptoms: no vaginal discharge   Risk factors: no new sexual partner        Home Medications Prior to Admission medications   Medication Sig Start Date End Date Taking? Authorizing Provider  metroNIDAZOLE (FLAGYL) 500 MG tablet Take 1 tablet (500 mg total) by mouth 2 (two) times daily for 7 days. 07/23/21 07/30/21  Elson Areas, PA-C      Allergies    Lisinopril    Review of Systems   Review of Systems  Genitourinary:  Positive for vaginal bleeding. Negative for vaginal discharge.  All other systems reviewed and are negative.   Physical Exam Updated Vital Signs BP (!) 150/80   Pulse 84   Temp 98.5 F (36.9 C) (Oral)   Resp 16   Ht 5\' 5"  (1.651 m)   Wt 131.5 kg   SpO2 99%   BMI 48.26 kg/m  Physical Exam Vitals and nursing note reviewed.  Constitutional:      General: She is not in acute distress.    Appearance: She is well-developed.  HENT:     Head: Normocephalic and atraumatic.  Eyes:     Conjunctiva/sclera: Conjunctivae normal.  Cardiovascular:     Rate and Rhythm: Normal rate and regular rhythm.     Heart sounds: No murmur heard. Pulmonary:     Effort: Pulmonary effort is  normal. No respiratory distress.     Breath sounds: Normal breath sounds.  Abdominal:     Palpations: Abdomen is soft.     Tenderness: There is no abdominal tenderness.  Genitourinary:    General: Normal vulva.     Comments: Small amount of dark blood,  adnexa nontender, cervix nontender  Musculoskeletal:        General: No swelling.     Cervical back: Neck supple.  Skin:    General: Skin is warm and dry.     Capillary Refill: Capillary refill takes less than 2 seconds.  Neurological:     General: No focal deficit present.     Mental Status: She is alert.  Psychiatric:        Mood and Affect: Mood normal.     ED Results / Procedures / Treatments   Labs (all labs ordered are listed, but only abnormal results are displayed) Labs Reviewed  WET PREP, GENITAL - Abnormal; Notable for the following components:      Result Value   Clue Cells Wet Prep HPF POC PRESENT (*)    All other components within normal limits  CBC WITH DIFFERENTIAL/PLATELET - Abnormal; Notable for the following components:   WBC 11.9 (*)    Neutro Abs 7.8 (*)  All other components within normal limits  BASIC METABOLIC PANEL - Abnormal; Notable for the following components:   Glucose, Bld 116 (*)    All other components within normal limits  PREGNANCY, URINE  GC/CHLAMYDIA PROBE AMP (Onset) NOT AT [facility]    EKG None  Radiology No results found.  Procedures Procedures    Medications Ordered in ED Medications - No data to display  ED Course/ Medical Decision Making/ A&P                           Medical Decision Making Pt complains of vaginal bleeding  Amount and/or Complexity of Data Reviewed Labs: ordered. Decision-making details documented in ED Course.    Details: labs ordered, reviewed and interpreted.   Pt has a negative pregnancy test.  wet prep shows bacterial vaginitis  Risk Prescription drug management.           Final Clinical Impression(s) / ED Diagnoses Final  diagnoses:  BV (bacterial vaginosis)    Rx / DC Orders ED Discharge Orders          Ordered    metronidazole (FLAGYL) 375 MG capsule  2 times daily,   Status:  Discontinued        07/23/21 1556    metroNIDAZOLE (FLAGYL) 500 MG tablet  2 times daily,   Status:  Discontinued        07/23/21 1557    metroNIDAZOLE (FLAGYL) 500 MG tablet  2 times daily        07/23/21 1605           An After Visit Summary was printed and given to the patient.    Elson Areas, PA-C 07/23/21 1843    Sloan Leiter, DO 07/24/21 1950

## 2021-07-24 LAB — GC/CHLAMYDIA PROBE AMP (~~LOC~~) NOT AT ARMC
Chlamydia: NEGATIVE
Comment: NEGATIVE
Comment: NORMAL
Neisseria Gonorrhea: NEGATIVE

## 2021-09-15 ENCOUNTER — Emergency Department (HOSPITAL_BASED_OUTPATIENT_CLINIC_OR_DEPARTMENT_OTHER)
Admission: EM | Admit: 2021-09-15 | Discharge: 2021-09-15 | Disposition: A | Discharge status: Home / Self Care | Payer: BC Managed Care – PPO | Attending: Emergency Medicine | Admitting: Emergency Medicine

## 2021-09-15 ENCOUNTER — Encounter (HOSPITAL_BASED_OUTPATIENT_CLINIC_OR_DEPARTMENT_OTHER): Payer: Self-pay | Admitting: Emergency Medicine

## 2021-09-15 ENCOUNTER — Emergency Department (HOSPITAL_BASED_OUTPATIENT_CLINIC_OR_DEPARTMENT_OTHER): Payer: BC Managed Care – PPO

## 2021-09-15 ENCOUNTER — Other Ambulatory Visit: Payer: Self-pay

## 2021-09-15 DIAGNOSIS — I10 Essential (primary) hypertension: Secondary | ICD-10-CM | POA: Diagnosis not present

## 2021-09-15 DIAGNOSIS — Z87891 Personal history of nicotine dependence: Secondary | ICD-10-CM | POA: Diagnosis not present

## 2021-09-15 DIAGNOSIS — M25562 Pain in left knee: Secondary | ICD-10-CM | POA: Diagnosis not present

## 2021-09-15 DIAGNOSIS — X501XXA Overexertion from prolonged static or awkward postures, initial encounter: Secondary | ICD-10-CM | POA: Insufficient documentation

## 2021-09-15 DIAGNOSIS — S99912A Unspecified injury of left ankle, initial encounter: Secondary | ICD-10-CM | POA: Diagnosis present

## 2021-09-15 DIAGNOSIS — E119 Type 2 diabetes mellitus without complications: Secondary | ICD-10-CM | POA: Insufficient documentation

## 2021-09-15 DIAGNOSIS — Y990 Civilian activity done for income or pay: Secondary | ICD-10-CM | POA: Insufficient documentation

## 2021-09-15 DIAGNOSIS — S93402A Sprain of unspecified ligament of left ankle, initial encounter: Secondary | ICD-10-CM | POA: Insufficient documentation

## 2021-09-15 MED ORDER — IBUPROFEN 400 MG PO TABS
600.0000 mg | ORAL_TABLET | Freq: Once | ORAL | Status: AC
  Administered 2021-09-15: 600 mg via ORAL
  Filled 2021-09-15: qty 1

## 2021-09-15 NOTE — ED Triage Notes (Signed)
Patient presents to ED via POV from home. Here with left knee, ankle and foot pain. Reports a few days she rolled her foot on a rock. Ambulatory.

## 2021-09-15 NOTE — ED Notes (Signed)
Patient transported to X-ray 

## 2021-09-15 NOTE — ED Provider Notes (Signed)
[facility] EMERGENCY DEPARTMENT Provider Note  CSN: [id number] Arrival date & time: 09/15/21 1215  Chief Complaint(s) Knee Pain  HPI Nichole Stone is a 46 y.o. female with history of diabetes, hypertension presenting to the emergency department with left knee and left ankle pain.  Patient reports she twisted her ankle at work a few days ago.  She has been ambulatory.  She reports symptoms are mild.  No numbness or tingling.  No fevers or chills.  Did not fall in the accident.  No other injuries.   Past Medical History Past Medical History:  Diagnosis Date   Diabetes mellitus without complication (HCC)    Fibromyalgia    Fibromyalgia    Hypertension    was taken off her meds she states by dr   Obesity    Patient Active Problem List   Diagnosis Date Noted   Arthritis of left knee 08/14/2020   Headache 01/03/2009   Home Medication(s) Prior to Admission medications   Not on File                                                                                                                                    Past Surgical History Past Surgical History:  Procedure Laterality Date   CHOLECYSTECTOMY     HAND SURGERY     TONSILLECTOMY     TUBAL LIGATION     Family History Family History  Problem Relation Age of Onset   Diabetes Mother    Hypertension Mother    Cirrhosis Mother    Cancer Mother        Liver   Diabetes Father    Hypertension Father    Parkinson's disease Father    Dementia Father    Kidney failure Sister    Diabetes Sister     Social History Social History   Tobacco Use   Smoking status: Former    Packs/day: 0.25    Years: 10.00    Total pack years: 2.50    Types: Cigarettes   Smokeless tobacco: Never  Vaping Use   Vaping Use: Never used  Substance Use Topics   Alcohol use: Not Currently   Drug use: Never   Allergies Lisinopril  Review of Systems Review of Systems  All other systems reviewed and are  negative.   Physical Exam Vital Signs  I have reviewed the triage vital signs BP (!) 163/103   Pulse 77   Temp 98 F (36.7 C) (Oral)   Resp 19   SpO2 98%  Physical Exam Vitals and nursing note reviewed.  Constitutional:      Appearance: Normal appearance.  HENT:     Head: Normocephalic and atraumatic.     Nose: Nose normal.     Mouth/Throat:     Mouth: Mucous membranes are moist.  Eyes:     Conjunctiva/sclera: Conjunctivae normal.  Cardiovascular:     Rate and Rhythm:  Normal rate and regular rhythm.     Pulses: Normal pulses.  Pulmonary:     Effort: Pulmonary effort is normal.  Musculoskeletal:     Cervical back: Neck supple.     Comments: Mild tenderness of the posterior medial malleolus on the left, no tenderness to the foot or remainder of the ankle.  No proximal fibular tenderness.  Mild tenderness to the lower knee with intact range of motion, no obvious ligamentous laxity on exam.  Skin:    General: Skin is warm and dry.     Capillary Refill: Capillary refill takes less than 2 seconds.  Neurological:     General: No focal deficit present.     Mental Status: She is alert.  Psychiatric:        Mood and Affect: Mood normal.        Behavior: Behavior normal.     ED Results and Treatments Labs (all labs ordered are listed, but only abnormal results are displayed) Labs Reviewed - No data to display                                                                                                                        Radiology DG Ankle Complete Left  Result Date: 09/15/2021 CLINICAL DATA:  Twisting injury to the left knee and ankle a couple of days ago. Pain. EXAM: LEFT ANKLE COMPLETE - 3+ VIEW COMPARISON:  None Available. FINDINGS: No acute fracture. No bone lesion. Small well corticated bone densities lie along the inferior margin of the medial malleolus which may reflect old avulsion fractures or accessory ossification centers. Ankle joint is normally spaced and  aligned. Moderate-sized plantar and small dorsal calcaneal spurs. Soft tissues are unremarkable. IMPRESSION: 1. No acute fracture.  No ankle joint abnormality. Electronically Signed   By: Amie Portland M.D.   On: 09/15/2021 15:00   DG Knee Complete 4 Views Left  Result Date: 09/15/2021 CLINICAL DATA:  Twisting injury to the left knee and ankle a couple of days ago. Pain. EXAM: LEFT KNEE - COMPLETE 4+ VIEW COMPARISON:  None Available. FINDINGS: No fracture.  No bone lesion. Knee joint normally spaced and aligned. Minimal marginal osteophytes from all 3 compartments. No other degenerative change. No joint effusion. Surrounding soft tissues are unremarkable. IMPRESSION: No fracture or acute finding. Electronically Signed   By: Amie Portland M.D.   On: 09/15/2021 14:59    Pertinent labs & imaging results that were available during my care of the patient were reviewed by me and considered in my medical decision making (see MDM for details).  Medications Ordered in ED Medications  ibuprofen (ADVIL) tablet 600 mg (600 mg Oral Given 09/15/21 1434)  Procedures Procedures  (including critical care time)  Medical Decision Making / ED Course   MDM:  46 year old female presenting with left ankle and knee pain.  Patient overall well-appearing.  Exam reassuring with mild tenderness to the ankle and knee, but otherwise no deformity, no obvious swelling.  Patient has been ambulatory.  X-rays obtained and negative for acute fracture.  Doubt occult fracture given patient has been ambulatory.  No tenderness to the foot to suggest foot injury.  Discussed results with the patient.  Advised conservative management and follow-up with primary physician. Will discharge patient to home. All questions answered. Patient comfortable with plan of discharge. Return precautions discussed with  patient and specified on the after visit summary.       Additional history obtained:  -External records from outside source obtained and reviewed including: Chart review including previous notes, labs, imaging, consultation notes   Imaging Studies ordered: I ordered imaging studies including XR left foot and knee On my interpretation imaging demonstrates no acute fracture I independently visualized and interpreted imaging. I agree with the radiologist interpretation   Medicines ordered and prescription drug management: Meds ordered this encounter  Medications   ibuprofen (ADVIL) tablet 600 mg    -I have reviewed the patients home medicines and have made adjustments as needed   Cardiac Monitoring: The patient was maintained on a cardiac monitor.  I personally viewed and interpreted the cardiac monitored which showed an underlying rhythm of: NSR  Social Determinants of Health:  Factors impacting patients care include: former smoker   Reevaluation: After the interventions noted above, I reevaluated the patient and found that they have improved  Co morbidities that complicate the patient evaluation  Past Medical History:  Diagnosis Date   Diabetes mellitus without complication ([place])    Fibromyalgia    Fibromyalgia    Hypertension    was taken off her meds she states by dr   Obesity       Dispostion: Discharge    Final Clinical Impression(s) / ED Diagnoses Final diagnoses:  Sprain of left ankle, unspecified ligament, initial encounter     This chart was dictated using voice recognition software.  Despite best efforts to proofread,  errors can occur which can change the documentation meaning.    Cristie Hem, MD 09/15/21 [phone]

## 2022-02-24 ENCOUNTER — Ambulatory Visit: Payer: Self-pay | Admitting: Obstetrics & Gynecology

## 2023-04-19 ENCOUNTER — Other Ambulatory Visit: Payer: Self-pay

## 2023-04-19 DIAGNOSIS — I728 Aneurysm of other specified arteries: Secondary | ICD-10-CM

## 2023-04-29 ENCOUNTER — Other Ambulatory Visit

## 2023-05-07 ENCOUNTER — Encounter (HOSPITAL_BASED_OUTPATIENT_CLINIC_OR_DEPARTMENT_OTHER): Payer: Self-pay | Admitting: Urology

## 2023-05-07 ENCOUNTER — Emergency Department (HOSPITAL_BASED_OUTPATIENT_CLINIC_OR_DEPARTMENT_OTHER)
Admission: EM | Admit: 2023-05-07 | Discharge: 2023-05-07 | Disposition: A | Discharge status: Home / Self Care | Attending: Emergency Medicine | Admitting: Emergency Medicine

## 2023-05-07 ENCOUNTER — Other Ambulatory Visit: Payer: Self-pay

## 2023-05-07 DIAGNOSIS — M79602 Pain in left arm: Secondary | ICD-10-CM | POA: Insufficient documentation

## 2023-05-07 NOTE — ED Triage Notes (Signed)
 Pt states left elbow pain x 1 month that radiates into left forearm and left upper arm  Pt is left handed, drives and delivers pizzas for work   Unable to take NSAIDS due to weight loss sx prep

## 2023-05-07 NOTE — Discharge Instructions (Signed)
 Call Orthopedist to schedule appointment for further evaluation. Continue Tylenol  as needed for pain. Continue elbow bracing as tolerated. Return to the emergency department if you experience joint swelling/redness/fever associated with this injury.

## 2023-05-07 NOTE — ED Provider Notes (Signed)
  EMERGENCY DEPARTMENT AT [facility] Provider Note   CSN: [id number] Arrival date & time: 05/07/23  1830     History  Chief Complaint  Patient presents with   Elbow Pain    Nichole Stone Dorris Pierre is a 48 y.o. female.  48 year old female presenting with 1 month of left arm pain.  Patient is left-handed and frequently has to lift heavy boxes at work.  She describes pain in her left arm that is exacerbated by certain movements, with pain "radiating from my elbow down my forearm".  She was seen by her PCP for this issue, they recommended that she wear an elbow brace for a likely diagnosis of tennis elbow.  She is often unable to tolerate the elbow brace as it is too tight.  She cannot take NSAIDs as she is preparing for a gastric bypass procedure, she has been taking Tylenol  with minimal relief.  She was seen at a separate emergency department several days ago, imaging of her left arm was obtained however she did not stay for results.  She denies joint swelling/erythema, no fevers, no acute injury.        Home Medications Prior to Admission medications   Not on File      Allergies    Lisinopril and Losartan    Review of Systems   Review of Systems  Respiratory:  Negative for shortness of breath.   Cardiovascular:  Negative for chest pain.  Musculoskeletal:  Positive for myalgias.    Physical Exam Updated Vital Signs BP (!) 168/99 (BP Location: Right Wrist)   Pulse 74   Temp 98.5 F (36.9 C) (Oral)   Resp 17   Ht 5\' 5"  (1.651 m)   Wt 131.5 kg   SpO2 98%   BMI 48.24 kg/m  Physical Exam Vitals and nursing note reviewed.  HENT:     Head: Normocephalic.  Eyes:     Extraocular Movements: Extraocular movements intact.  Cardiovascular:     Rate and Rhythm: Normal rate.  Pulmonary:     Effort: Pulmonary effort is normal.  Musculoskeletal:     Comments: L arm: In-tact passive range of motion at the elbow and shoulder. Flexion at the elbow is  limited secondary to pain, extension is in-tact. Internal rotation at the shoulder is limited secondary to pain, external rotation is in-tact. No joint swelling, erythema, bruising, or deformity of the elbow.  Diminished grip strength of left hand.  Neurological:     General: No focal deficit present.     Mental Status: She is alert.     ED Results / Procedures / Treatments   Labs (all labs ordered are listed, but only abnormal results are displayed) Labs Reviewed - No data to display  EKG None  Radiology No results found.  Procedures Procedures    Medications Ordered in ED Medications - No data to display  ED Course/ Medical Decision Making/ A&P                                 Medical Decision Making This patient presents to the ED for concern of atraumatic left arm pain, this involves an extensive number of treatment options, and is a complaint that carries with it a high risk of complications and morbidity.  The differential diagnosis includes musculoskeletal pain, nerve entrapment, bursitis, septic arthritis, fracture.   Additional history obtained:  External records from outside source obtained and  reviewed including most recent emergency department note  Imaging Studies ordered:  I reviewed the radiologic interpretation of imaging studies completed on 05/04/2023 at a separate facility outside of our system. Results were interpreted as follows: 3 view x-ray of left elbow with No acute fracture or malalignment. No joint effusion. Soft tissues unremarkable.   Problem List / ED Course / Critical interventions / Medication managemen I have reviewed the patients home medicines and have made adjustments as needed   Test / Admission - Considered:  Three-view x-ray of the elbow completed at [facility] facility 05/04/2023, see above for radiologic interpretation. Physical exam is reassuring, there does not appear to be any acute bony abnormalities or concern for joint  pathology, no erythema/redness/swelling of the elbow.  Pain is likely secondary to overuse injury.  Patient is appropriate for discharge at this time.  Continue elbow brace as previously recommended by PCP.  Continue Tylenol  as needed for pain.  Schedule follow-up with orthopedist for further evaluation and management.    Amount and/or Complexity of Data Reviewed External Data Reviewed: radiology and notes.    Details: As above.           Final Clinical Impression(s) / ED Diagnoses Final diagnoses:  Left arm pain    Rx / DC Orders ED Discharge Orders     None         Kendrick Pax, Nichole Stone 05/07/23 2338    Owen Blowers P, DO 05/15/23 2326

## 2023-05-10 ENCOUNTER — Encounter: Payer: Self-pay | Admitting: Vascular Surgery

## 2023-05-11 ENCOUNTER — Other Ambulatory Visit

## 2023-05-12 ENCOUNTER — Other Ambulatory Visit

## 2023-05-14 ENCOUNTER — Other Ambulatory Visit

## 2023-05-19 ENCOUNTER — Ambulatory Visit: Admitting: Vascular Surgery

## 2023-06-03 ENCOUNTER — Inpatient Hospital Stay: Admission: RE | Admit: 2023-06-03 | Source: Ambulatory Visit
